# Patient Record
Sex: Male | Born: 1985
Health system: Southern US, Community
[De-identification: ages and names within clinical notes are randomized; demographics above are authoritative.]

---

## 2017-06-19 DIAGNOSIS — M7989 Other specified soft tissue disorders: Secondary | ICD-10-CM | POA: Diagnosis not present

## 2017-06-19 DIAGNOSIS — M7662 Achilles tendinitis, left leg: Secondary | ICD-10-CM | POA: Diagnosis not present

## 2018-02-02 DIAGNOSIS — M609 Myositis, unspecified: Secondary | ICD-10-CM | POA: Diagnosis not present

## 2018-02-02 DIAGNOSIS — M545 Low back pain: Secondary | ICD-10-CM | POA: Diagnosis not present

## 2018-02-06 DIAGNOSIS — T391X2A Poisoning by 4-Aminophenol derivatives, intentional self-harm, initial encounter: Secondary | ICD-10-CM | POA: Diagnosis not present

## 2018-02-06 DIAGNOSIS — R45851 Suicidal ideations: Secondary | ICD-10-CM | POA: Diagnosis not present

## 2018-02-06 DIAGNOSIS — F329 Major depressive disorder, single episode, unspecified: Secondary | ICD-10-CM | POA: Diagnosis not present

## 2018-02-06 DIAGNOSIS — F17210 Nicotine dependence, cigarettes, uncomplicated: Secondary | ICD-10-CM | POA: Diagnosis not present

## 2018-02-07 ENCOUNTER — Inpatient Hospital Stay (HOSPITAL_COMMUNITY)
Admission: AD | Admit: 2018-02-07 | Discharge: 2018-02-11 | DRG: 881 | Disposition: A | Discharge status: Home / Self Care | Payer: 59 | Source: Intra-hospital | Attending: Psychiatry | Admitting: Psychiatry

## 2018-02-07 ENCOUNTER — Encounter (HOSPITAL_COMMUNITY): Payer: Self-pay

## 2018-02-07 ENCOUNTER — Other Ambulatory Visit: Payer: Self-pay

## 2018-02-07 DIAGNOSIS — F419 Anxiety disorder, unspecified: Secondary | ICD-10-CM | POA: Diagnosis not present

## 2018-02-07 DIAGNOSIS — F4325 Adjustment disorder with mixed disturbance of emotions and conduct: Secondary | ICD-10-CM | POA: Diagnosis not present

## 2018-02-07 DIAGNOSIS — F4320 Adjustment disorder, unspecified: Secondary | ICD-10-CM | POA: Diagnosis present

## 2018-02-07 DIAGNOSIS — F1290 Cannabis use, unspecified, uncomplicated: Secondary | ICD-10-CM | POA: Diagnosis not present

## 2018-02-07 DIAGNOSIS — T391X2A Poisoning by 4-Aminophenol derivatives, intentional self-harm, initial encounter: Secondary | ICD-10-CM | POA: Diagnosis present

## 2018-02-07 DIAGNOSIS — F329 Major depressive disorder, single episode, unspecified: Secondary | ICD-10-CM | POA: Diagnosis not present

## 2018-02-07 DIAGNOSIS — T1491XA Suicide attempt, initial encounter: Secondary | ICD-10-CM | POA: Diagnosis not present

## 2018-02-07 DIAGNOSIS — R748 Abnormal levels of other serum enzymes: Secondary | ICD-10-CM | POA: Diagnosis present

## 2018-02-07 DIAGNOSIS — T39012A Poisoning by aspirin, intentional self-harm, initial encounter: Secondary | ICD-10-CM | POA: Diagnosis present

## 2018-02-07 DIAGNOSIS — F17210 Nicotine dependence, cigarettes, uncomplicated: Secondary | ICD-10-CM | POA: Diagnosis present

## 2018-02-07 DIAGNOSIS — Z630 Problems in relationship with spouse or partner: Secondary | ICD-10-CM | POA: Diagnosis not present

## 2018-02-07 DIAGNOSIS — R450 Nervousness: Secondary | ICD-10-CM | POA: Diagnosis not present

## 2018-02-07 DIAGNOSIS — F332 Major depressive disorder, recurrent severe without psychotic features: Secondary | ICD-10-CM | POA: Diagnosis not present

## 2018-02-07 DIAGNOSIS — Z813 Family history of other psychoactive substance abuse and dependence: Secondary | ICD-10-CM | POA: Diagnosis not present

## 2018-02-07 DIAGNOSIS — G4700 Insomnia, unspecified: Secondary | ICD-10-CM | POA: Diagnosis not present

## 2018-02-07 DIAGNOSIS — Z818 Family history of other mental and behavioral disorders: Secondary | ICD-10-CM | POA: Diagnosis not present

## 2018-02-07 DIAGNOSIS — T391X4A Poisoning by 4-Aminophenol derivatives, undetermined, initial encounter: Secondary | ICD-10-CM | POA: Diagnosis not present

## 2018-02-07 MED ORDER — IBUPROFEN 600 MG PO TABS
600.0000 mg | ORAL_TABLET | Freq: Four times a day (QID) | ORAL | Status: DC | PRN
  Administered 2018-02-07: 600 mg via ORAL
  Filled 2018-02-07: qty 1

## 2018-02-07 MED ORDER — HYDROXYZINE HCL 25 MG PO TABS
25.0000 mg | ORAL_TABLET | Freq: Three times a day (TID) | ORAL | Status: DC | PRN
Start: 2018-02-07 — End: 2018-02-11
  Administered 2018-02-07 – 2018-02-11 (×2): 25 mg via ORAL
  Filled 2018-02-07 (×3): qty 1

## 2018-02-07 MED ORDER — ACETAMINOPHEN 325 MG PO TABS: 650.0000 mg | ORAL_TABLET | Freq: Four times a day (QID) | ORAL | Status: DC | PRN

## 2018-02-07 MED ORDER — ALUM & MAG HYDROXIDE-SIMETH 200-200-20 MG/5ML PO SUSP: 30.0000 mL | ORAL | Status: DC | PRN

## 2018-02-07 MED ORDER — TRAZODONE HCL 50 MG PO TABS
50.0000 mg | ORAL_TABLET | Freq: Every evening | ORAL | Status: DC | PRN
  Administered 2018-02-07: 50 mg via ORAL
  Filled 2018-02-07 (×2): qty 1

## 2018-02-07 MED ORDER — NICOTINE POLACRILEX 2 MG MT GUM
2.0000 mg | CHEWING_GUM | OROMUCOSAL | Status: DC | PRN
Start: 2018-02-07 — End: 2018-02-11
  Administered 2018-02-07 – 2018-02-10 (×8): 2 mg via ORAL
  Filled 2018-02-07 (×6): qty 1

## 2018-02-07 MED ORDER — MAGNESIUM HYDROXIDE 400 MG/5ML PO SUSP: 30.0000 mL | Freq: Every day | ORAL | Status: DC | PRN

## 2018-02-07 NOTE — Tx Team (Signed)
Initial Treatment Plan 02/07/2018 10:04 PM Ricky Bond JYN:[id number]    PATIENT STRESSORS: Marital or family conflict Occupational concerns   PATIENT STRENGTHS: Capable of independent living Communication skills General fund of knowledge Motivation for treatment/growth Physical Health   PATIENT IDENTIFIED PROBLEMS: Depression  Suicidal ideation  "I want to get back to being mentally stable"  "Not have racing thought"               DISCHARGE CRITERIA:  Improved stabilization in mood, thinking, and/or behavior Verbal commitment to aftercare and medication compliance  PRELIMINARY DISCHARGE PLAN: Outpatient therapy Medication management  PATIENT/FAMILY INVOLVEMENT: This treatment plan has been presented to and reviewed with the patient, Ricky Bond.  The patient and family have been given the opportunity to ask questions and make suggestions.  Levin BaconHeather V Panhia Karl, RN 02/07/2018, 10:04 PM

## 2018-02-07 NOTE — Progress Notes (Signed)
Ricky Bond is a 32 year old male being admitted voluntarily to [address] [facility].  He came to the ED after taking 18 aspirin pills after argument with his ex-wife.  [facility], he denies SI/HI or A/V hallucinations.  He does report some depression, anxiety and racing thoughts.  He reports using marijuana daily to help with the anxiety.  He denies any medical issues and appears to be in no physical distress.  Oriented him to the unit.  Admission paperwork completed and signed.  Belongings searched and secured in locker # 52, no contraband found.  Skin assessment completed and no skin issues ntoed.  Q 15 minute checks initiated for safety.  We will continue to monitor the progress towards his goals.

## 2018-02-07 NOTE — BH Assessment (Addendum)
Assessment Note  Ricky AweJeromy R Bond is a 32 y.o. separated male who presented to [facility] for drug overdose. Pt reports he is separated from his wife for several months and learned today that she is in a new relationship. Pt states it was an impulsive reaction to grab the pills and take an overdose of 10-18 pills (either Tylenol or Asprin- both are listed in record).  Pt states he knew immediately that it was a poor decision and went to ED to get checked out. Pt reports he regrets taking the pills and that he has children to live for. Pt denies SI, HI & AVH. He denies hx of abuse. He has no hx of suicide attempts or inpt hospitalization. Pt reports he has anxiety but does not have a psychiatrist or therapist.  Diagnosis: F32.2 MDD single episode severe, without psychosis   Disposition: Leighton Ruffina Okonkwo, NP recommends Inpatient Therapy   Past Medical History: No past medical history on file.  Family History: No family history on file.  Social History:  has no tobacco, alcohol, and drug history on file.  Additional Social History:  Alcohol / Drug Use Pain Medications: see MAR Prescriptions: see MAR Over the Counter: see MAR Substance #1 Name of Substance 1: alcohol 1 - Frequency: occasional use Substance #2 Name of Substance 2: marijuana 2 - Frequency: pt reports use for tx of anxiety  CIWA:   COWS:    Allergies: Allergies not on file  Home Medications:  No medications prior to admission.    OB/GYN Status:  No LMP for male patient.  General Assessment Data Location of Assessment: [facility] TTS Assessment: Out of system Is this a Tele or Face-to-Face Assessment?: Face-to-Face Is this an Initial Assessment or a Re-assessment for this encounter?: Initial Assessment Marital status: Separated Living Arrangements: (with friends) Can pt return to current living arrangement?: Yes Admission Status: Voluntary Is patient capable of signing voluntary admission?:  Yes Referral Source: Other([facility]) Insurance type: [employer]     Crisis Care Plan Living Arrangements: (with friends) Name of Psychiatrist: none Name of Therapist: none  Education Status Highest grade of school patient has completed: 10th  Risk to self with the past 6 months Suicidal Ideation: No(denies) Has patient been a risk to self within the past 6 months prior to admission? : Yes Suicidal Intent: Yes-Currently Present(on impulse per pt) Is patient at risk for suicide?: Yes Suicidal Plan?: Yes-Currently Present(Took overdose of 10-16 tylenol) Has patient had any suicidal plan within the past 6 months prior to admission? : Other (comment) Specify Current Suicidal Plan: denies, states taking pills was impulsive reaction Access to Means: Yes What has been your use of drugs/alcohol within the last 12 months?: occasrional alcohol; regular marijuana for anxiety Previous Attempts/Gestures: No Intentional Self Injurious Behavior: (none reported) Family Suicide History: Unknown Recent stressful life event(s): Divorce Persecutory voices/beliefs?: No Depression Symptoms: Tearfulness, Despondent Substance abuse history and/or treatment for substance abuse?: No Suicide prevention information given to non-admitted patients: Not applicable  Risk to Others within the past 6 months Homicidal Ideation: No History of harm to others?: No Assessment of Violence: None Noted Criminal Charges Pending?: No Does patient have a court date: No Is patient on probation?: No  Psychosis Hallucinations: None noted Delusions: None noted  Mental Status Report Appearance/Hygiene: Unremarkable, In hospital gown Eye Contact: Good Motor Activity: Freedom of movement Speech: Logical/coherent Level of Consciousness: Alert Mood: Anxious Affect: Anxious, Blunted, Depressed Anxiety Level: Moderate Thought Processes: Coherent, Relevant Judgement:  Partial Orientation: Person, Place,  Time, Situation Obsessive Compulsive Thoughts/Behaviors: None  Cognitive Functioning Concentration: Normal Memory: Recent Intact, Remote Intact IQ: Average Insight: Fair Impulse Control: Fair  ADLScreening [facility]) Patient's cognitive ability adequate to safely complete daily activities?: Yes Patient able to express need for assistance with ADLs?: Yes Independently performs ADLs?: Yes (appropriate for developmental age)  Prior Inpatient Therapy Prior Inpatient Therapy: No  Prior Outpatient Therapy Prior Outpatient Therapy: No  ADL Screening (condition at time of admission) Patient's cognitive ability adequate to safely complete daily activities?: Yes Is the patient deaf or have difficulty hearing?: No Does the patient have difficulty seeing, even when wearing glasses/contacts?: No Does the patient have difficulty concentrating, remembering, or making decisions?: No Patient able to express need for assistance with ADLs?: Yes Does the patient have difficulty dressing or bathing?: No Independently performs ADLs?: Yes (appropriate for developmental age) Communication: Independent Dressing (OT): Independent Grooming: Independent Feeding: Independent Bathing: Independent Toileting: Independent In/Out Bed: Independent Walks in Home: Independent Does the patient have difficulty walking or climbing stairs?: No Weakness of Legs: None Weakness of Arms/Hands: None  Home Assistive Devices/Equipment Home Assistive Devices/Equipment: None  Therapy Consults (therapy consults require a physician order) PT Evaluation Needed: No OT Evalulation Needed: No SLP Evaluation Needed: No Abuse/Neglect Assessment (Assessment to be complete while patient is alone) Abuse/Neglect Assessment Can Be Completed: Unable to assess, patient is non-responsive or altered mental status Physical Abuse: Denies Values / Beliefs Cultural Requests During Hospitalization: None Spiritual  Requests During Hospitalization: None Consults Spiritual Care Consult Needed: No Social Work Consult Needed: No      Additional Information Does patient have medical clearance?: Yes     Disposition:  Disposition Initial Assessment Completed for this Encounter: Yes Disposition of Patient: Inpatient treatment program Type of inpatient treatment program: Adult  On Site Evaluation by:   Reviewed with Physician:    Clearnce Sorrel 02/07/2018 5:27 PM

## 2018-02-08 DIAGNOSIS — Z818 Family history of other mental and behavioral disorders: Secondary | ICD-10-CM

## 2018-02-08 DIAGNOSIS — T39012A Poisoning by aspirin, intentional self-harm, initial encounter: Secondary | ICD-10-CM

## 2018-02-08 DIAGNOSIS — Z813 Family history of other psychoactive substance abuse and dependence: Secondary | ICD-10-CM

## 2018-02-08 DIAGNOSIS — F1290 Cannabis use, unspecified, uncomplicated: Secondary | ICD-10-CM

## 2018-02-08 DIAGNOSIS — T391X2A Poisoning by 4-Aminophenol derivatives, intentional self-harm, initial encounter: Secondary | ICD-10-CM

## 2018-02-08 DIAGNOSIS — F17210 Nicotine dependence, cigarettes, uncomplicated: Secondary | ICD-10-CM

## 2018-02-08 DIAGNOSIS — G4700 Insomnia, unspecified: Secondary | ICD-10-CM

## 2018-02-08 DIAGNOSIS — F329 Major depressive disorder, single episode, unspecified: Principal | ICD-10-CM

## 2018-02-08 DIAGNOSIS — F419 Anxiety disorder, unspecified: Secondary | ICD-10-CM

## 2018-02-08 DIAGNOSIS — R450 Nervousness: Secondary | ICD-10-CM

## 2018-02-08 DIAGNOSIS — Z630 Problems in relationship with spouse or partner: Secondary | ICD-10-CM

## 2018-02-08 DIAGNOSIS — T1491XA Suicide attempt, initial encounter: Secondary | ICD-10-CM

## 2018-02-08 MED ORDER — SERTRALINE HCL 50 MG PO TABS
50.0000 mg | ORAL_TABLET | Freq: Every day | ORAL | Status: DC
  Administered 2018-02-08 – 2018-02-11 (×4): 50 mg via ORAL
  Filled 2018-02-08 (×7): qty 1

## 2018-02-08 NOTE — H&P (Addendum)
Psychiatric Admission Assessment Adult  Patient Identification: Ricky Bond MRN:  [id number] Date of Evaluation:  02/08/2018 Chief Complaint:  MDD Principal Diagnosis: MDD (major depressive disorder) Diagnosis:   Patient Active Problem List   Diagnosis Date Noted  . MDD (major depressive disorder) [F32.9] 02/07/2018   History of Present Illness:  Per assessment note- Ricky Bond is a 32 y.o. separated male who presented to [facility] ED for drug overdose. Pt reports he is separated from his wife for several months and learned today that she is in a new relationship. Pt states it was an impulsive reaction to grab the pills and take an overdose of 10-18 pills (either Tylenol or Asprin- both are listed in record).  Pt states he knew immediately that it was a poor decision and went to ED to get checked out. Pt reports he regrets taking the pills and that he has children to live for. Pt denies SI, HI & AVH. He denies hx of abuse. He has no hx of suicide attempts or inpt hospitalization. Pt reports he has anxiety but does not have a psychiatrist or therapist.  On Evaluation: Ricky Bond is awake, alert and oriented.  Denies suicidal or homicidal ideation during this assessment. Denies auditory or visual hallucination and does not appear to be responding to internal stimuli.  Reports history of depression and this "event pushed me over the edge"  Report he is interested in taking medications.  Support, encouragement and reassurance was provided.   Associated Signs/Symptoms: Depression Symptoms:  depressed mood, feelings of worthlessness/guilt, difficulty concentrating, (Hypo) Manic Symptoms:  Distractibility, Anxiety Symptoms:  Excessive Worry, Psychotic Symptoms:  Hallucinations: None PTSD Symptoms: Hyperarousal:  Difficulty Concentrating Irritability/Anger Total Time spent with patient: 20 minutes  Past Psychiatric History:   Is the patient at risk to self? Yes.    Has the  patient been a risk to self in the past 6 months? Yes.    Has the patient been a risk to self within the distant past? Yes.    Is the patient a risk to others? No.  Has the patient been a risk to others in the past 6 months? Yes.    Has the patient been a risk to others within the distant past? No.   Prior Inpatient Therapy: Prior Inpatient Therapy: No Prior Outpatient Therapy: Prior Outpatient Therapy: No  Alcohol Screening: 1. How often do you have a drink containing alcohol?: Monthly or less 2. How many drinks containing alcohol do you have on a typical day when you are drinking?: 1 or 2 3. How often do you have six or more drinks on one occasion?: Never AUDIT-C Score: 1 9. Have you or someone else been injured as a result of your drinking?: No 10. Has a relative or friend or a doctor or another health worker been concerned about your drinking or suggested you cut down?: No Alcohol Use Disorder Identification Test Final Score (AUDIT): 1 Intervention/Follow-up: AUDIT Score <7 follow-up not indicated Substance Abuse History in the last 12 months:  No. Consequences of Substance Abuse: NA Previous Psychotropic Medications: no Psychological Evaluations: no  Past Medical History: History reviewed. No pertinent past medical history. History reviewed. No pertinent surgical history. Family History:  Family History  Family history unknown: Yes   Family Psychiatric  History: Tobacco Screening: Have you used any form of tobacco in the last 30 days? (Cigarettes, Smokeless Tobacco, Cigars, and/or Pipes): Yes Tobacco use, Select all that apply: 5 or more cigarettes per day  Are you interested in Tobacco Cessation Medications?: Yes, will notify MD for an order Counseled patient on smoking cessation including recognizing danger situations, developing coping skills and basic information about quitting provided: Refused/Declined practical counseling Social History:  Social History   Substance and  Sexual Activity  Alcohol Use No  . Frequency: Never     Social History   Substance and Sexual Activity  Drug Use Yes  . Types: Marijuana    Additional Social History: Marital status: Separated    Pain Medications: see MAR Prescriptions: see MAR Over the Counter: see MAR Name of Substance 1: alcohol 1 - Frequency: occasional use Name of Substance 2: marijuana 2 - Frequency: pt reports use for tx of anxiety                Allergies:  No Known Allergies Lab Results: No results found for this or any previous visit (from the past 108 hour(s)).  Blood Alcohol level:  No results found for: [facility]  Metabolic Disorder Labs:  No results found for: HGBA1C, MPG No results found for: PROLACTIN No results found for: CHOL, TRIG, HDL, CHOLHDL, VLDL, LDLCALC  Current Medications: Current Facility-Administered Medications  Medication Dose Route Frequency Provider Last Rate Last Dose  . acetaminophen (TYLENOL) tablet 650 mg  650 mg Oral Q6H PRN Okonkwo, Justina A, NP      . alum & mag hydroxide-simeth (MAALOX/MYLANTA) 200-200-20 MG/5ML suspension 30 mL  30 mL Oral Q4H PRN Okonkwo, Justina A, NP      . hydrOXYzine (ATARAX/VISTARIL) tablet 25 mg  25 mg Oral TID PRN Lu Duffel, Justina A, NP   25 mg at 02/07/18 2216  . ibuprofen (ADVIL,MOTRIN) tablet 600 mg  600 mg Oral Q6H PRN Lindon Romp A, NP   600 mg at 02/07/18 2054  . magnesium hydroxide (MILK OF MAGNESIA) suspension 30 mL  30 mL Oral Daily PRN Okonkwo, Justina A, NP      . nicotine polacrilex (NICORETTE) gum 2 mg  2 mg Oral PRN Glynn Freas, Myer Peer, MD   2 mg at 02/07/18 2006  . sertraline (ZOLOFT) tablet 50 mg  50 mg Oral Daily Derrill Center, NP      . traZODone (DESYREL) tablet 50 mg  50 mg Oral QHS PRN Okonkwo, Justina A, NP   50 mg at 02/07/18 2216   PTA Medications: No medications prior to admission.    Musculoskeletal: Strength & Muscle Tone: within normal limits Gait & Station: normal Patient leans: N/A  Psychiatric  Specialty Exam: Physical Exam  Nursing note and vitals reviewed. Constitutional: He is oriented to person, place, and time. He appears well-developed.  Neurological: He is alert and oriented to person, place, and time.  Psychiatric: He has a normal mood and affect. His behavior is normal.    Review of Systems  Psychiatric/Behavioral: Positive for suicidal ideas. The patient is nervous/anxious.     Blood pressure (!) 141/90, pulse 80, temperature 98.9 F (37.2 C), temperature source Oral, resp. rate 18, height 6' (1.829 m), weight 100.9 kg (222 lb 8 oz), SpO2 99 %.Body mass index is 30.18 kg/m.  General Appearance: Casual  Eye Contact:  Good  Speech:  Clear and Coherent  Volume:  Normal  Mood:  Anxious and Depressed  Affect:  Appropriate and Congruent  Thought Process:  Coherent  Orientation:  Full (Time, Place, and Person)  Thought Content:  NA  Suicidal Thoughts:  Yes.  with intent/plan  Homicidal Thoughts:  No  Memory:  Immediate;  Fair Recent;   Fair Remote;   Fair  Judgement:  Fair  Insight:  Fair  Psychomotor Activity:  Normal  Concentration:  Concentration: Fair  Recall:  [employer] of Knowledge:  Fair  Language:  Fair  Akathisia:  No  Handed:  Right  AIMS (if indicated):     Assets:  Communication Skills Desire for Improvement Resilience Social Support  ADL's:  Intact  Cognition:  WNL  Sleep:  Number of Hours: 6.75    Treatment Plan Summary: Daily contact with patient to assess and evaluate symptoms and progress in treatment and Medication management    Initiate  Zoloft 50 mg  mood stabilization. Continue with Trazodone 50 mg for insomnia  Will continue to monitor vitals ,medication compliance and treatment side effects while patient is here.  Reviewed labs- see [place]  chart  ,BAL -, UDS - positive  thc  CSW will start working on disposition.  Patient to participate in therapeutic milieu  Observation Level/Precautions:  15 minute checks   Laboratory:  CBC Chemistry Profile HbAIC UDS  Psychotherapy:   Individual and group session  Medications:  See SRA  Consultations:  CSW and Psychiatry  Discharge Concerns:  Safety, stabilization, and risk of access to medication and medication stabilization   Estimated LOS: 5-7days  Other:     Physician Treatment Plan for Primary Diagnosis: MDD (major depressive disorder) Long Term Goal(s): Improvement in symptoms so as ready for discharge  Short Term Goals: Ability to identify changes in lifestyle to reduce recurrence of condition will improve, Ability to verbalize feelings will improve, Ability to disclose and discuss suicidal ideas and Ability to demonstrate self-control will improve  Physician Treatment Plan for Secondary Diagnosis: Principal Problem:   MDD (major depressive disorder)  Long Term Goal(s): Improvement in symptoms so as ready for discharge  Short Term Goals: Ability to demonstrate self-control will improve, Ability to identify and develop effective coping behaviors will improve, Ability to maintain clinical measurements within normal limits will improve and Compliance with prescribed medications will improve  I certify that inpatient services furnished can reasonably be expected to improve the patient's condition.    Derrill Center, NP 2/24/20198:58 AM   I have discussed case with NP and have met with patient  Agree with NP note and assessment  32 year old male, married but currently separated, has 6 children, who currently live with their mothers. Employed . Patient presented to [facility] ED on 2/22. States " I had been doing OK, but have been separated for a few months and what she says still gets to me". States he had a recent discussion with her during which she threatened to relocate out of state and take his children with her. States that he impulsively overdosed on about 69 ASA/Acetaminophen  tablets. It was unplanned, " spur of the moment". States she  contacted police and was brought to ED. This event occurred 2/22. States he has been vaguely depressed, sad, but states " it was mild, not bad, I was just trying to stay focused on my work" " I was doing OK".  Denies neuro-vegetative symptoms recently. Denies any history of psychosis.  No prior psychiatric admissions, no prior suicide attempts, no history of severe depression, no history of mania, no history of psychosis. Denies panic or agoraphobia. Describes history of excessive worrying , " I do worry a lot ". States he has never been on any psychiatric medications  Denies alcohol abuse, smokes cannabis regularly, denies other drug use .  Denies medical issues .  Dx- Adjustment Disorder, with Depressed Mood, Anxiety. Consider GAD. S/P Overdose .   Plan- Inpatient admission. Has been started on  Zoloft for depression, anxiety. Zoloft 50 mgrs QDAY. Trazodone and Vistaril PrNS.  Will recheck LFTs , based on recent acetaminophen overdose.

## 2018-02-08 NOTE — Progress Notes (Signed)
Writer spoke with patient 1:after his admission was completed. He was informed of his medications available and requested gum.He attended group shortly after. He is adjusting to the unit. Safety maintained on unit with 15 min checks.

## 2018-02-08 NOTE — [facility] Group Notes (Signed)
[facility] Group Notes:  (Nursing/MHT/Case Management/Adjunct)  Date:  02/08/2018  Time:  9:14 AM  Type of Therapy:  Goals/Orientation Group.  Participation Level:  Active  Participation Quality:  Appropriate  Affect:  Appropriate  Cognitive:  Appropriate  Insight:  Appropriate  Engagement in Group:  Engaged  Modes of Intervention:  Discussion  Summary of Progress/Problems: Pt attended goals/orientation group, pt was receptive.   Javonni Macke Shanta 02/08/2018, 9:14 AM 

## 2018-02-08 NOTE — Progress Notes (Signed)
Adult Psychoeducational Group Note  Date:  02/08/2018 Time:  9:19 PM  Group Topic/Focus:  Wrap-Up Group:   The focus of this group is to help patients review their daily goal of treatment and discuss progress on daily workbooks.  Participation Level:  Active  Participation Quality:  Appropriate  Affect:  Appropriate  Cognitive:  Alert and Oriented  Insight: Good  Engagement in Group:  Engaged  Modes of Intervention:  Discussion  Additional Comments:    Leo GrosserMegan A Darris Carachure 02/08/2018, 9:19 PM

## 2018-02-08 NOTE — [facility] Group Notes (Signed)
[facility]  Date/Time:  02/08/2018  11:00AM-12:00PM  Type of Therapy and Topic:  Group Therapy:  Music and Mood  Participation Level:  Active   Description of Group: In this process group, members listened to a variety of genres of music and identified that different types of music evoke different responses.  Patients were encouraged to identify music that was soothing for them and music that was energizing for them.  Patients discussed how this knowledge can help with wellness and recovery in various ways including managing depression and anxiety as well as encouraging healthy sleep habits.    Therapeutic Goals: 1. Patients will explore the impact of different varieties of music on mood 2. Patients will verbalize the thoughts they have when listening to different types of music 3. Patients will identify music that is soothing to them as well as music that is energizing to them 4. Patients will discuss how to use this knowledge to assist in maintaining wellness and recovery 5. Patients will explore the use of music as a coping skill  Summary of Patient Progress:  At the beginning of group, patient was not present, but he arrived for the last half of group and said he felt okay at the beginning, but better and "where I want to be" at the end.  Therapeutic Modalities: Solution Focused Brief Therapy Activity   Ricky MantleMareida Grossman-Orr, LCSW

## 2018-02-08 NOTE — Progress Notes (Signed)
Writer has observed patient up and active on the unit. Writer spoke with him 1:1 after he requested gum and he reports that he is glad that he came here. He reports being hesitant at first but feels that this is something he needed. He reports attending group and is making new friends while here. Support given and safety maintained on unit with 15 min checks.

## 2018-02-08 NOTE — [facility] Group Notes (Signed)
[facility] Group Notes:  (Nursing/MHT/Case Management/Adjunct)  Date:  02/08/2018  Time:  3:00 PM  Type of Therapy:  Psychoeducational Skills  Participation Level:  Active  Participation Quality:  Appropriate  Affect:  Appropriate  Cognitive:  Appropriate  Insight:  Appropriate  Engagement in Group:  Engaged  Modes of Intervention:  Problem-solving  Summary of Progress/Problems: Pt attended Psychoeducational group with top topic love languages.   Jacquelyne BalintForrest, Jaclene Bartelt Shanta 02/08/2018, 3:00 PM

## 2018-02-08 NOTE — [facility] Suicide Risk Assessment (Signed)
[facility]   Nursing information obtained from:  Patient Demographic factors:  Male, Caucasian Current Mental Status:  NA Loss Factors:  NA Historical Factors:  Family history of mental illness or substance abuse Risk Reduction Factors:  Responsible for children under 34 years of age, Living with another person, especially a relative  Total Time spent with patient: 45 minutes Principal Problem:  S/P Overdose  Diagnosis:   Patient Active Problem List   Diagnosis Date Noted  . MDD (major depressive disorder) [F32.9] 02/07/2018    Continued Clinical Symptoms:  Alcohol Use Disorder Identification Test Final Score (AUDIT): 1 The "Alcohol Use Disorders Identification Test", Guidelines for Use in Primary Care, Second Edition.  World Science writerHealth Organization [facility](WHO). Score between 0-7:  no or low risk or alcohol related problems. Score between 8-15:  moderate risk of alcohol related problems. Score between 16-19:  high risk of alcohol related problems. Score 20 or above:  warrants further diagnostic evaluation for alcohol dependence and treatment.   CLINICAL FACTORS:  32 year old male, married but currently separated, has 6 children, who currently live with their mothers. Employed . Patient presented to [facility] ED on 2/22. States " I had been doing OK, but have been separated for a few months and what she says still gets to me". States he had a recent discussion with her during which she threatened to relocate out of state and take his children with her. States that he impulsively overdosed on about 3918 ASA/Acetaminophen  tablets. It was unplanned, " spur of the moment". States she contacted police and was brought to ED. This event occurred 2/22. States he has been vaguely depressed, sad, but states " it was mild, not bad, I was just trying to stay focused on my work" " I was doing OK".  Denies neuro-vegetative symptoms recently. Denies any history of psychosis.  No prior  psychiatric admissions, no prior suicide attempts, no history of severe depression, no history of mania, no history of psychosis. Denies panic or agoraphobia. Describes history of excessive worrying , " I do worry a lot ". States he has never been on any psychiatric medications  Denies alcohol abuse, smokes cannabis regularly, denies other drug use .  Denies medical issues .  Dx- Adjustment Disorder, with Depressed Mood, Anxiety. Consider GAD. S/P Overdose .   Plan- Inpatient admission. Has been started on  Zoloft for depression, anxiety. Zoloft 50 mgrs QDAY. Trazodone and Vistaril PrNS.  Will recheck LFTs , based on recent acetaminophen overdose.     Musculoskeletal: Strength & Muscle Tone: within normal limits Gait & Station: normal Patient leans: N/A  Psychiatric Specialty Exam: Physical Exam  ROS no headache, no chest pain, no dyspnea, no nausea , no vomiting, no choluria or acholia, no RUQ pain  Blood pressure (!) 141/90, pulse 80, temperature 98.9 F (37.2 C), temperature source Oral, resp. rate 18, height 6' (1.829 m), weight 100.9 kg (222 lb 8 oz), SpO2 99 %.Body mass index is 30.18 kg/m.  General Appearance: Well Groomed  Eye Contact:  Good  Speech:  Normal Rate  Volume:  Normal  Mood:  reports feeling better today, states " my mood is pretty good today"  Affect:  Appropriate and vaguely anxious   Thought Process:  Linear and Descriptions of Associations: Intact  Orientation:  Full (Time, Place, and Person)  Thought Content:  no hallucinations, no delusions, not internally preoccupied   Suicidal Thoughts:  No denies suicidal or self injurious ideations, denies any  homicidal or violent ideations  Homicidal Thoughts:  No  Memory:  recent and remote grossly intact   Judgement:  Other:  improved  Insight:  improved   Psychomotor Activity:  Normal  Concentration:  Concentration: Good and Attention Span: Good  Recall:  Good  Fund of Knowledge:  Good  Language:  Good   Akathisia:  Negative  Handed:  Right  AIMS (if indicated):     Assets:  Communication Skills Desire for Improvement Resilience  ADL's:  Intact  Cognition:  WNL  Sleep:  Number of Hours: 6.75      COGNITIVE FEATURES THAT CONTRIBUTE TO RISK:  Closed-mindedness and Loss of executive function    SUICIDE RISK:   Mild:  Suicidal ideation of limited frequency, intensity, duration, and specificity.  There are no identifiable plans, no associated intent, mild dysphoria and related symptoms, good self-control (both objective and subjective assessment), few other risk factors, and identifiable protective factors, including available and accessible social support.  PLAN OF CARE: Patient will be admitted to inpatient psychiatric unit for stabilization and safety. Will provide and encourage milieu participation. Provide medication management and maked adjustments as needed.  Will follow daily.    I certify that inpatient services furnished can reasonably be expected to improve the patient's condition.   Craige Cotta, MD 02/08/2018, 9:51 AM

## 2018-02-09 DIAGNOSIS — F332 Major depressive disorder, recurrent severe without psychotic features: Secondary | ICD-10-CM

## 2018-02-09 DIAGNOSIS — T391X4A Poisoning by 4-Aminophenol derivatives, undetermined, initial encounter: Secondary | ICD-10-CM

## 2018-02-09 LAB — TSH: TSH: 2.853 u[IU]/mL (ref 0.350–4.500)

## 2018-02-09 LAB — HEPATIC FUNCTION PANEL
ALT: 90 U/L — ABNORMAL HIGH (ref 17–63)
AST: 64 U/L — AB (ref 15–41)
Albumin: 4.8 g/dL (ref 3.5–5.0)
Alkaline Phosphatase: 83 U/L (ref 38–126)
BILIRUBIN DIRECT: 0.1 mg/dL (ref 0.1–0.5)
BILIRUBIN INDIRECT: 0.9 mg/dL (ref 0.3–0.9)
BILIRUBIN TOTAL: 1 mg/dL (ref 0.3–1.2)
Total Protein: 7.6 g/dL (ref 6.5–8.1)

## 2018-02-09 MED ORDER — IBUPROFEN 600 MG PO TABS: 600.0000 mg | ORAL_TABLET | Freq: Four times a day (QID) | ORAL | Status: DC | PRN

## 2018-02-09 NOTE — Tx Team (Signed)
Interdisciplinary Treatment and Diagnostic Plan Update  02/09/2018 Time of Session: 10:00a Ricky Bond MRN: [id number]  Principal Diagnosis: MDD (major depressive disorder)  Secondary Diagnoses: Principal Problem:   MDD (major depressive disorder) Active Problems:   Intentional acetaminophen overdose ([place])   Current Medications:  Current Facility-Administered Medications  Medication Dose Route Frequency Provider Last Rate Last Dose  . acetaminophen (TYLENOL) tablet 650 mg  650 mg Oral Q6H PRN Okonkwo, Justina A, NP      . alum & mag hydroxide-simeth (MAALOX/MYLANTA) 200-200-20 MG/5ML suspension 30 mL  30 mL Oral Q4H PRN Okonkwo, Justina A, NP      . hydrOXYzine (ATARAX/VISTARIL) tablet 25 mg  25 mg Oral TID PRN Lu Duffel, Justina A, NP   25 mg at 02/07/18 2216  . ibuprofen (ADVIL,MOTRIN) tablet 600 mg  600 mg Oral Q6H PRN Lindon Romp A, NP   600 mg at 02/07/18 2054  . magnesium hydroxide (MILK OF MAGNESIA) suspension 30 mL  30 mL Oral Daily PRN Okonkwo, Justina A, NP      . nicotine polacrilex (NICORETTE) gum 2 mg  2 mg Oral PRN Cobos, Myer Peer, MD   2 mg at 02/09/18 0801  . sertraline (ZOLOFT) tablet 50 mg  50 mg Oral Daily Derrill Center, NP   50 mg at 02/09/18 0800  . traZODone (DESYREL) tablet 50 mg  50 mg Oral QHS PRN Okonkwo, Justina A, NP   50 mg at 02/07/18 2216   PTA Medications: No medications prior to admission.    Patient Stressors: Marital or family conflict Occupational concerns  Patient Strengths: Capable of independent living Curator fund of knowledge Motivation for treatment/growth Physical Health  Treatment Modalities: Medication Management, Group therapy, Case management,  1 to 1 session with clinician, Psychoeducation, Recreational therapy.   Physician Treatment Plan for Primary Diagnosis: MDD (major depressive disorder) Long Term Goal(s): Improvement in symptoms so as ready for discharge Improvement in symptoms so as ready  for discharge   Short Term Goals: Ability to identify changes in lifestyle to reduce recurrence of condition will improve Ability to verbalize feelings will improve Ability to disclose and discuss suicidal ideas Ability to demonstrate self-control will improve Ability to demonstrate self-control will improve Ability to identify and develop effective coping behaviors will improve Ability to maintain clinical measurements within normal limits will improve Compliance with prescribed medications will improve  Medication Management: Evaluate patient's response, side effects, and tolerance of medication regimen.  Therapeutic Interventions: 1 to 1 sessions, Unit Group sessions and Medication administration.  Evaluation of Outcomes: Not Met  Physician Treatment Plan for Secondary Diagnosis: Principal Problem:   MDD (major depressive disorder) Active Problems:   Intentional acetaminophen overdose ([place])  Long Term Goal(s): Improvement in symptoms so as ready for discharge Improvement in symptoms so as ready for discharge   Short Term Goals: Ability to identify changes in lifestyle to reduce recurrence of condition will improve Ability to verbalize feelings will improve Ability to disclose and discuss suicidal ideas Ability to demonstrate self-control will improve Ability to demonstrate self-control will improve Ability to identify and develop effective coping behaviors will improve Ability to maintain clinical measurements within normal limits will improve Compliance with prescribed medications will improve     Medication Management: Evaluate patient's response, side effects, and tolerance of medication regimen.  Therapeutic Interventions: 1 to 1 sessions, Unit Group sessions and Medication administration.  Evaluation of Outcomes: Not Met   RN Treatment Plan for Primary Diagnosis: MDD (major depressive disorder)  Long Term Goal(s): Knowledge of disease and therapeutic regimen to  maintain health will improve  Short Term Goals: Ability to remain free from injury will improve, Ability to verbalize frustration and anger appropriately will improve, Ability to demonstrate self-control, Ability to participate in decision making will improve, Ability to verbalize feelings will improve, Ability to disclose and discuss suicidal ideas, Ability to identify and develop effective coping behaviors will improve and Compliance with prescribed medications will improve  Medication Management: RN will administer medications as ordered by provider, will assess and evaluate patient's response and provide education to patient for prescribed medication. RN will report any adverse and/or side effects to prescribing provider.  Therapeutic Interventions: 1 on 1 counseling sessions, Psychoeducation, Medication administration, Evaluate responses to treatment, Monitor vital signs and CBGs as ordered, Perform/monitor CIWA, COWS, AIMS and Fall Risk screenings as ordered, Perform wound care treatments as ordered.  Evaluation of Outcomes: Not Met   LCSW Treatment Plan for Primary Diagnosis: MDD (major depressive disorder) Long Term Goal(s): Safe transition to appropriate next level of care at discharge, Engage patient in therapeutic group addressing interpersonal concerns.  Short Term Goals: Engage patient in aftercare planning with referrals and resources, Increase social support, Increase ability to appropriately verbalize feelings, Increase emotional regulation, Facilitate acceptance of mental health diagnosis and concerns, Facilitate patient progression through stages of change regarding substance use diagnoses and concerns, Identify triggers associated with mental health/substance abuse issues and Increase skills for wellness and recovery  Therapeutic Interventions: Assess for all discharge needs, 1 to 1 time with Social worker, Explore available resources and support systems, Assess for adequacy in  community support network, Educate family and significant other(s) on suicide prevention, Complete Psychosocial Assessment, Interpersonal group therapy.  Evaluation of Outcomes: Not Met   Progress in Treatment: Attending groups: No.New to unit Participating in groups: No. Taking medication as prescribed: No. Toleration medication: Yes. Family/Significant other contact made: No, will contact:  CSW will assess for an appropiate contact, if patient gives consent Patient understands diagnosis: Yes. Discussing patient identified problems/goals with staff: Yes. Medical problems stabilized or resolved: Yes. Denies suicidal/homicidal ideation: Yes. Issues/concerns per patient self-inventory: No. Other:   New problem(s) identified: None  New Short Term/Long Term Goal(s):medication stabilization, elimination of SI thoughts, development of comprehensive mental wellness plan.   Patient Goal: "To think positive and treat my depression"   Discharge Plan or Barriers: CSW will assess for an appropriate discharge plan.   Reason for Continuation of Hospitalization: Anxiety Depression Medication stabilization Suicidal ideation  Estimated Length of Stay: Wednesday, 02/11/18  Attendees: Patient: Ricky Bond 02/09/2018 8:52 AM  Physician: Dr. Neita Garnet 02/09/2018 8:52 AM  Nursing: Chrys Racer, RN  02/09/2018 8:52 AM  RN Care Manager: Rhunette Croft 02/09/2018 8:52 AM  Social Worker: Radonna Ricker, [place] 02/09/2018 8:52 AM  Recreational Therapist: Rhunette Croft 02/09/2018 8:52 AM  Other:  02/09/2018 8:52 AM  Other:  02/09/2018 8:52 AM  Other: 02/09/2018 8:52 AM    Scribe for Treatment Team: Marylee Floras, [place] 02/09/2018 8:52 AM

## 2018-02-09 NOTE — Plan of Care (Signed)
  Progressing Education: Emotional status will improve 02/09/2018 1017 - Progressing by Angela AdamBeaudry, Konnor Jorden E, RN Mental status will improve 02/09/2018 1017 - Progressing by Angela AdamBeaudry, Linsi Humann E, RN

## 2018-02-09 NOTE — Progress Notes (Addendum)
[facility] MD Progress Note  02/09/2018 2:42 PM Ricky Bond  MRN:  [id number]  Subjective: Ricky Bond reports, "I feel a lot better today. I have not felt this good in a very long time. I feel like my old-self again. I'm sleeping good, eating good. Group sessions has been great. I came here because I got into it with my ex-wife. She knows how to push that button to get to me. This time, I fell for & ate 18 tablets of Tylenol, impulsively. As soon as I had done that, I knew it was wrong, I went to the ED right away. I feel grate today".  Objective: 32 year old male, married but currently separated, has 6 children, who currently live with their mothers. Employed. Patient presented to [facility] ED on 2/22. States " I had been doing OK, but have been separated for a few months and what she says still gets to me". States he had a recent discussion with her during which she threatened to relocate out of state and take his children with her. States that he impulsively overdosed on about 38 ASA/Acetaminophen tablets. It was unplanned, " spur of the moment". States she contacted police and was brought to ED. This event occurred 2/22. States he has been vaguely depressed, sad, but states " it was mild, not bad, I was just trying to stay focused on my work" " I was doing. OK".  Denies neuro-vegetative symptoms recently. Denies any history   Principal Problem: MDD (major depressive disorder)  Diagnosis:   Patient Active Problem List   Diagnosis Date Noted  . Intentional acetaminophen overdose ([facility]) [T39.1X2A]   . MDD (major depressive disorder) [F32.9] 02/07/2018   Total Time spent with patient: 25 minutes  Past Psychiatric History: See H&P.  Past Medical History: History reviewed. No pertinent past medical history. History reviewed. No pertinent surgical history.  Family History:  Family History  Family history unknown: Yes   Family Psychiatric  History: See H&P.  Social History:  Social History    Substance and Sexual Activity  Alcohol Use No  . Frequency: Never     Social History   Substance and Sexual Activity  Drug Use Yes  . Types: Marijuana    Social History   Socioeconomic History  . Marital status: Married    Spouse name: None  . Number of children: None  . Years of education: None  . Highest education level: None  Social Needs  . Financial resource strain: None  . Food insecurity - worry: None  . Food insecurity - inability: None  . Transportation needs - medical: None  . Transportation needs - non-medical: None  Occupational History  . None  Tobacco Use  . Smoking status: Current Every Day Smoker    Packs/day: 1.00    Types: Cigarettes  . Smokeless tobacco: Never Used  Substance and Sexual Activity  . Alcohol use: No    Frequency: Never  . Drug use: Yes    Types: Marijuana  . Sexual activity: Yes  Other Topics Concern  . None  Social History Narrative  . None   Additional Social History:  Pain Medications: see MAR Prescriptions: see MAR Over the Counter: see MAR Name of Substance 1: alcohol 1 - Frequency: occasional use Name of Substance 2: marijuana 2 - Frequency: pt reports use for tx of anxiety  Sleep: Good  Appetite:  Good  Current Medications: Current Facility-Administered Medications  Medication Dose Route Frequency Provider Last Rate Last Dose  .  acetaminophen (TYLENOL) tablet 650 mg  650 mg Oral Q6H PRN Okonkwo, Justina A, NP      . alum & mag hydroxide-simeth (MAALOX/MYLANTA) 200-200-20 MG/5ML suspension 30 mL  30 mL Oral Q4H PRN Okonkwo, Justina A, NP      . hydrOXYzine (ATARAX/VISTARIL) tablet 25 mg  25 mg Oral TID PRN Beryle Lathekonkwo, Justina A, NP   25 mg at 02/07/18 2216  . ibuprofen (ADVIL,MOTRIN) tablet 600 mg  600 mg Oral Q6H PRN Cobos, Fernando A, MD      . magnesium hydroxide (MILK OF MAGNESIA) suspension 30 mL  30 mL Oral Daily PRN Okonkwo, Justina A, NP      . nicotine polacrilex (NICORETTE) gum 2 mg  2 mg Oral PRN Cobos,  Rockey SituFernando A, MD   2 mg at 02/09/18 0801  . sertraline (ZOLOFT) tablet 50 mg  50 mg Oral Daily Oneta RackLewis, Tanika N, NP   50 mg at 02/09/18 0800  . traZODone (DESYREL) tablet 50 mg  50 mg Oral QHS PRN Beryle Lathekonkwo, Justina A, NP   50 mg at 02/07/18 2216   Lab Results:  Results for orders placed or performed during the hospital encounter of 02/07/18 (from the past 48 hour(s))  TSH     Status: None   Collection Time: 02/09/18  6:27 AM  Result Value Ref Range   TSH 2.853 0.350 - 4.500 uIU/mL    Comment: Performed by a 3rd Generation assay with a functional sensitivity of <=0.01 uIU/mL. Performed at [facility], [address] [place], [place] [address]   Hepatic function panel     Status: Abnormal   Collection Time: 02/09/18  6:27 AM  Result Value Ref Range   Total Protein 7.6 6.5 - 8.1 g/dL   Albumin 4.8 3.5 - 5.0 g/dL   AST 64 (H) 15 - 41 U/L   ALT 90 (H) 17 - 63 U/L   Alkaline Phosphatase 83 38 - 126 U/L   Total Bilirubin 1.0 0.3 - 1.2 mg/dL   Bilirubin, Direct 0.1 0.1 - 0.5 mg/dL   Indirect Bilirubin 0.9 0.3 - 0.9 mg/dL    Comment: Performed at [facility], [address] [place], [place] [address]   Blood Alcohol level:  No results found for: [facility]  Metabolic Disorder Labs: No results found for: HGBA1C, MPG No results found for: PROLACTIN No results found for: CHOL, TRIG, HDL, CHOLHDL, VLDL, LDLCALC  Physical Findings: AIMS: Facial and Oral Movements Muscles of Facial Expression: None, normal Lips and Perioral Area: None, normal Jaw: None, normal Tongue: None, normal,Extremity Movements Upper (arms, wrists, hands, fingers): None, normal Lower (legs, knees, ankles, toes): None, normal, Trunk Movements Neck, shoulders, hips: None, normal, Overall Severity Severity of abnormal movements (highest score from questions above): None, normal Incapacitation due to abnormal movements: None, normal Patient's awareness of abnormal movements (rate only  patient's report): No Awareness, Dental Status Current problems with teeth and/or dentures?: No Does patient usually wear dentures?: No  CIWA:    COWS:     Musculoskeletal: Strength & Muscle Tone: within normal limits Gait & Station: normal Patient leans: N/A  Psychiatric Specialty Exam: Physical Exam  ROS  Blood pressure (!) 144/85, pulse 75, temperature 97.8 F (36.6 C), temperature source Oral, resp. rate 18, height 6' (1.829 m), weight 100.9 kg (222 lb 8 oz), SpO2 99 %.Body mass index is 30.18 kg/m.  General Appearance: Well Groomed  Eye Contact:  Good  Speech:  Normal Rate  Volume:  Normal  Mood:  reports feeling better today, states " my mood is pretty good today"  Affect:  Appropriate and vaguely anxious   Thought Process:  Linear and Descriptions of Associations: Intact  Orientation:  Full (Time, Place, and Person)  Thought Content:  no hallucinations, no delusions, not internally preoccupied   Suicidal Thoughts:  No denies suicidal or self injurious ideations, denies any homicidal or violent ideations  Homicidal Thoughts:  No  Memory:  recent and remote grossly intact   Judgement:  Other:  improved  Insight:  improved   Psychomotor Activity:  Normal  Concentration:  Concentration: Good and Attention Span: Good  Recall:  Good  Fund of Knowledge:  Good  Language:  Good  Akathisia:  Negative  Handed:  Right  AIMS (if indicated):     Assets:  Communication Skills Desire for Improvement Resilience  ADL's:  Intact  Cognition:  WNL  Sleep:  Number of Hours: 6.75     Treatment Plan Summary: Daily contact with patient to assess and evaluate symptoms and progress in treatment: Patient reports improved mood. He says his symptoms are responding to his treatment regimen.    - Will continue today 02/09/2018 plan as below except where it is noted.  Anxiety.    - Continue Hydroxyzine 25 mg prn for anxiety.  Depression.    - Continue Sertraline 50 mg po  daily.  Insomnia.    - Continue Trazodone 50 mg po prn Q hs.  Nicotine withdrawal.    - Continue Nicorette gum prn.     - Encourage group participation.    - Discharge disposition plan ongoing.  Armandina Stammer, NP, PMHNP, FNP-BC. 02/09/2018, 2:42 PM   Agree with NP Progress Note
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
## 2018-02-09 NOTE — [facility] Group Notes (Signed)
Adult Psychoeducational Group Note  Date:  02/09/2018 Time:  1:19 PM  Group Topic/Focus:  Wellness Toolbox:   The focus of this group is to discuss various aspects of wellness, balancing those aspects and exploring ways to increase the ability to experience wellness.  Patients will create a wellness toolbox for use upon discharge.  Participation Level:  Active  Participation Quality:  Appropriate  Affect:  Appropriate  Cognitive:  Appropriate  Insight: Improving  Engagement in Group:  Engaged  Modes of Intervention:  Discussion  Additional Comments:    Donell BeersRodney S Destane Speas 02/09/2018, 1:19 PM

## 2018-02-09 NOTE — [facility] Group Notes (Signed)
[facility] LCSW Group Therapy Note  Date/Time:02/09/18, 1315  Type of Therapy and Topic:  Group Therapy:  Overcoming Obstacles  Participation Level: moderate   Description of Group:    In this group patients will be encouraged to explore what they see as obstacles to their own wellness and recovery. They will be guided to discuss their thoughts, feelings, and behaviors related to these obstacles. The group will process together ways to cope with barriers, with attention given to specific choices patients can make. Each patient will be challenged to identify changes they are motivated to make in order to overcome their obstacles. This group will be process-oriented, with patients participating in exploration of their own experiences as well as giving and receiving support and challenge from other group members.  Therapeutic Goals: 1. Patient will identify personal and current obstacles as they relate to admission. 2. Patient will identify barriers that currently interfere with their wellness or overcoming obstacles.  3. Patient will identify feelings, thought process and behaviors related to these barriers. 4. Patient will identify two changes they are willing to make to overcome these obstacles:    Summary of Patient Progress: Pt identified separation and coparenting as obstacles in his life.  Pt was active in group discussion regarding steps to overcome obstacles.        Therapeutic Modalities:   Cognitive Behavioral Therapy Solution Focused Therapy Motivational Interviewing Relapse Prevention Therapy  Daleen SquibbGreg Haiden Rawlinson, LCSW

## 2018-02-09 NOTE — Progress Notes (Signed)
Adult Psychoeducational Group Note  Date:  02/09/2018 Time:  8:55 PM  Group Topic/Focus:  Wrap-Up Group:   The focus of this group is to help patients review their daily goal of treatment and discuss progress on daily workbooks.  Participation Level:  Active  Participation Quality:  Appropriate  Affect:  Appropriate  Cognitive:  Appropriate  Insight: Appropriate  Engagement in Group:  Engaged  Modes of Intervention:  Discussion  Additional Comments: The patient expressed that he rates today a 9.The patient also said that he attend the Obstacle group. Octavio Mannshigpen, Charman Blasco Lee 02/09/2018, 8:55 PM

## 2018-02-09 NOTE — Progress Notes (Signed)
D: Patient appears bright and denies any depressive symptoms.  He denies HI/AVH and is stating he is not having any thoughts of self harm.  He reports he is sleeping and eating well; his energy level is high; concentration is good.  He is attending groups and participating in his treatment.  He is smiling and interacting well with his peers.    A: Continue to monitor medication management and MD orders.  Safety checks continued every 15 minutes per protocol.  Offer support and encouragement.    R: Patient is receptive to staff; his behavior is appropriate.

## 2018-02-10 DIAGNOSIS — F4325 Adjustment disorder with mixed disturbance of emotions and conduct: Secondary | ICD-10-CM

## 2018-02-10 DIAGNOSIS — F4320 Adjustment disorder, unspecified: Secondary | ICD-10-CM

## 2018-02-10 LAB — HEPATIC FUNCTION PANEL
ALT: 167 U/L — AB (ref 17–63)
AST: 94 U/L — AB (ref 15–41)
Albumin: 4.7 g/dL (ref 3.5–5.0)
Alkaline Phosphatase: 98 U/L (ref 38–126)
BILIRUBIN TOTAL: 1 mg/dL (ref 0.3–1.2)
Bilirubin, Direct: 0.2 mg/dL (ref 0.1–0.5)
Indirect Bilirubin: 0.8 mg/dL (ref 0.3–0.9)
Total Protein: 7.7 g/dL (ref 6.5–8.1)

## 2018-02-10 MED ORDER — INFLUENZA VAC SPLIT QUAD 0.5 ML IM SUSY
0.5000 mL | PREFILLED_SYRINGE | INTRAMUSCULAR | Status: DC
  Filled 2018-02-10: qty 0.5

## 2018-02-10 MED ORDER — PNEUMOCOCCAL VAC POLYVALENT 25 MCG/0.5ML IJ INJ: 0.5000 mL | INJECTION | INTRAMUSCULAR | Status: DC

## 2018-02-10 NOTE — [facility] Group Notes (Signed)
LCSW Group Therapy Note 02/10/2018 12:46 PM  Type of Therapy/Topic: Group Therapy: Feelings about Diagnosis  Participation Level: Minimal   Description of Group:  This group will allow patients to explore their thoughts and feelings about diagnoses they have received. Patients will be guided to explore their level of understanding and acceptance of these diagnoses. Facilitator will encourage patients to process their thoughts and feelings about the reactions of others to their diagnosis and will guide patients in identifying ways to discuss their diagnosis with significant others in their lives. This group will be process-oriented, with patients participating in exploration of their own experiences, giving and receiving support, and processing challenge from other group members.  Therapeutic Goals: 1. Patient will demonstrate understanding of diagnosis as evidenced by identifying two or more symptoms of the disorder 2. Patient will be able to express two feelings regarding the diagnosis 3. Patient will demonstrate their ability to communicate their needs through discussion and/or role play  Summary of Patient Progress:  Ricky Bond was attentive throughout the group's session, however he did not contribute to the group's discussion. Ricky Bond stayed the entire group session.      Therapeutic Modalities:  Cognitive Behavioral Therapy Brief Therapy Feelings Identification    Ricky Bond Catalina AntiguaWilliams LCSWA Clinical Social Worker

## 2018-02-10 NOTE — Progress Notes (Signed)
D: Patient denies any thoughts of self harm or any depressive symptoms.  Patient has his ex-wife and girlfriend down on his consent form and his ex-wife called this morning to get information about his care.  She states, "this is his wife.  I need to know what his diagnosis is and how he is doing.  I need to know when he is being discharged, as I will probably need to pick him up."  Patient was informed he appears to be doing well with his treatment.  I told her I was not sure when he will be discharged.  Patient is sleeping is eating well; his energy level is high and his concentration is good.  A: Continue to monitor medication management and MD orders.  Safety checks completed every 15 minutes per protocol.  Offer support and encouragement as needed.  R: Patient is receptive to staff; his behavior has been appropriate.

## 2018-02-10 NOTE — [facility] Suicide Risk Assessment (Signed)
[facility] INPATIENT:  Family/Significant Other Suicide Prevention Education  Suicide Prevention Education:  Education Completed; Felipa EthDana Truitt, girlfriend, [phone], has been identified by the patient as the family member/significant other with whom the patient will be residing, and identified as the person(s) who will aid the patient in the event of a mental health crisis (suicidal ideations/suicide attempt).  With written consent from the patient, the family member/significant other has been provided the following suicide prevention education, prior to the and/or following the discharge of the patient.  The suicide prevention education provided includes the following:  Suicide risk factors  Suicide prevention and interventions  National Suicide Hotline telephone number  [facility] assessment telephone number  [facility] Emergency Assistance 911  [facility] and/or Residential Mobile Crisis Unit telephone number  Request made of family/significant other to:  Remove weapons (e.g., guns, rifles, knives), all items previously/currently identified as safety concern.  No guns in the home, per BriarcliffDana.  Remove drugs/medications (over-the-counter, prescriptions, illicit drugs), all items previously/currently identified as a safety concern.  The family member/significant other verbalizes understanding of the suicide prevention education information provided.  The family member/significant other agrees to remove the items of safety concern listed above.  Annabelle HarmanDana reports pt is struggling with the end of his marriage.  No other stressors that she is aware of.  She does want him to have some mental health follow up and CSW told her this will be arranged.  Lorri FrederickWierda, Amity Roes Jon, LCSW 02/10/2018, 10:06 AM

## 2018-02-10 NOTE — Progress Notes (Signed)
[facility] MD Progress Note  02/10/2018 3:24 PM Ricky Bond  MRN:  [id number] Subjective:   32 y.o Caucasian male, separated, lives with a friend, employed. Background history of Depression. Presented to the ER following an OD. Found out his wife has started a new relationship. Could not handle it and attempted to end his own life by taking 18 pills of Tylenol.  Routine labs significant for mildly elevated liver enzymes. Toxicology is negative. No substances.  Chart reviewed today. Patient discussed at team today.  Staff reports that he has been doing good. He has not expressed any suicidal thoughts lately. He has not been observed to be withdrawn. He has been sleeping well at night. No behavioral issue  Seen today. Tells me that they have been separated for a couple of months. Says he allowed her to get to him emotionally. Says they were texting back and forth while he was on his way to work. Says he felt down and took the pills. He had sent her a picture of the pills in his hand. Says the police showed up at his work and brought him to the hospital. Patient says he has had time to think things through. Says he is remorseful as he has thought about the effects it would have had on his kids. He has a total of six kids. Had his first three from a previous marriage. Says they have been married for eight years. Patient says he wants to be there for his kids. Says he has decided to move on. He is already talking to another male. Patient says he wants to get back to his job. Notes that the medication has helped him a lot. Says his spirits are much better. Says his family can tell that he is better on medication. Feels his mind is working normally.  He is able to attend to task. No lingering suicidal thoughts. No homicidal thoughts. No thoughts of violence. Says he is sleeping well at night. Patient hopes he can be back at his work soon.   Principal Problem: MDD (major depressive disorder) Diagnosis:    Patient Active Problem List   Diagnosis Date Noted  . Intentional acetaminophen overdose (HCC) [T39.1X2A]   . MDD (major depressive disorder) [F32.9] 02/07/2018   Total Time spent with patient: 45 minutes  Past Psychiatric History: As in H&P  Past Medical History: History reviewed. No pertinent past medical history. History reviewed. No pertinent surgical history. Family History:  Family History  Family history unknown: Yes   Family Psychiatric  History: As in H&P Social History:  Social History   Substance and Sexual Activity  Alcohol Use No  . Frequency: Never     Social History   Substance and Sexual Activity  Drug Use Yes  . Types: Marijuana    Social History   Socioeconomic History  . Marital status: Married    Spouse name: None  . Number of children: None  . Years of education: None  . Highest education level: None  Social Needs  . Financial resource strain: None  . Food insecurity - worry: None  . Food insecurity - inability: None  . Transportation needs - medical: None  . Transportation needs - non-medical: None  Occupational History  . None  Tobacco Use  . Smoking status: Current Every Day Smoker    Packs/day: 1.00    Types: Cigarettes  . Smokeless tobacco: Never Used  Substance and Sexual Activity  . Alcohol use: No    Frequency: Never  .  Drug use: Yes    Types: Marijuana  . Sexual activity: Yes  Other Topics Concern  . None  Social History Narrative  . None   Additional Social History:    Pain Medications: see MAR Prescriptions: see MAR Over the Counter: see MAR Name of Substance 1: alcohol 1 - Frequency: occasional use Name of Substance 2: marijuana 2 - Frequency: pt reports use for tx of anxiety                Sleep: Good  Appetite:  Good  Current Medications: Current Facility-Administered Medications  Medication Dose Route Frequency Provider Last Rate Last Dose  . acetaminophen (TYLENOL) tablet 650 mg  650 mg Oral Q6H  PRN Okonkwo, Justina A, NP      . alum & mag hydroxide-simeth (MAALOX/MYLANTA) 200-200-20 MG/5ML suspension 30 mL  30 mL Oral Q4H PRN Okonkwo, Justina A, NP      . hydrOXYzine (ATARAX/VISTARIL) tablet 25 mg  25 mg Oral TID PRN Beryle Lathe, Justina A, NP   25 mg at 02/07/18 2216  . ibuprofen (ADVIL,MOTRIN) tablet 600 mg  600 mg Oral Q6H PRN Cobos, Rockey Situ, MD      . Melene Muller ON 02/11/2018] Influenza vac split quadrivalent PF (FLUARIX) injection 0.5 mL  0.5 mL Intramuscular Tomorrow-1000 Izediuno, Vincent A, MD      . magnesium hydroxide (MILK OF MAGNESIA) suspension 30 mL  30 mL Oral Daily PRN Okonkwo, Justina A, NP      . nicotine polacrilex (NICORETTE) gum 2 mg  2 mg Oral PRN Cobos, Rockey Situ, MD   2 mg at 02/09/18 2243  . [START ON 02/11/2018] pneumococcal 23 valent vaccine (PNU-IMMUNE) injection 0.5 mL  0.5 mL Intramuscular Tomorrow-1000 Izediuno, Vincent A, MD      . sertraline (ZOLOFT) tablet 50 mg  50 mg Oral Daily Oneta Rack, NP   50 mg at 02/10/18 0754  . traZODone (DESYREL) tablet 50 mg  50 mg Oral QHS PRN Beryle Lathe, Justina A, NP   50 mg at 02/07/18 2216    Lab Results:  Results for orders placed or performed during the hospital encounter of 02/07/18 (from the past 48 hour(s))  TSH     Status: None   Collection Time: 02/09/18  6:27 AM  Result Value Ref Range   TSH 2.853 0.350 - 4.500 uIU/mL    Comment: Performed by a 3rd Generation assay with a functional sensitivity of <=0.01 uIU/mL. Performed at [facility] [address], [place], [place] [address]   Hepatic function panel     Status: Abnormal   Collection Time: 02/09/18  6:27 AM  Result Value Ref Range   Total Protein 7.6 6.5 - 8.1 g/dL   Albumin 4.8 3.5 - 5.0 g/dL   AST 64 (H) 15 - 41 U/L   ALT 90 (H) 17 - 63 U/L   Alkaline Phosphatase 83 38 - 126 U/L   Total Bilirubin 1.0 0.3 - 1.2 mg/dL   Bilirubin, Direct 0.1 0.1 - 0.5 mg/dL   Indirect Bilirubin 0.9 0.3 - 0.9 mg/dL    Comment: Performed at [facility], [address] [place], [place] [address]  Hepatic function panel     Status: Abnormal   Collection Time: 02/10/18  7:09 AM  Result Value Ref Range   Total Protein 7.7 6.5 - 8.1 g/dL   Albumin 4.7 3.5 - 5.0 g/dL   AST 94 (H) 15 - 41 U/L   ALT 167 (H) 17 -  63 U/L   Alkaline Phosphatase 98 38 - 126 U/L   Total Bilirubin 1.0 0.3 - 1.2 mg/dL   Bilirubin, Direct 0.2 0.1 - 0.5 mg/dL   Indirect Bilirubin 0.8 0.3 - 0.9 mg/dL    Comment: Performed at [facility], [address] [place], [place] [address]    Blood Alcohol level:  No results found for: [facility]  Metabolic Disorder Labs: No results found for: HGBA1C, MPG No results found for: PROLACTIN No results found for: CHOL, TRIG, HDL, CHOLHDL, VLDL, LDLCALC  Physical Findings: AIMS: Facial and Oral Movements Muscles of Facial Expression: None, normal Lips and Perioral Area: None, normal Jaw: None, normal Tongue: None, normal,Extremity Movements Upper (arms, wrists, hands, fingers): None, normal Lower (legs, knees, ankles, toes): None, normal, Trunk Movements Neck, shoulders, hips: None, normal, Overall Severity Severity of abnormal movements (highest score from questions above): None, normal Incapacitation due to abnormal movements: None, normal Patient's awareness of abnormal movements (rate only patient's report): No Awareness, Dental Status Current problems with teeth and/or dentures?: No Does patient usually wear dentures?: No  CIWA:    COWS:     Musculoskeletal: Strength & Muscle Tone: within normal limits Gait & Station: normal Patient leans: N/A  Psychiatric Specialty Exam: Physical Exam  Constitutional: He is oriented to person, place, and time. He appears well-developed and well-nourished.  HENT:  Head: Normocephalic and atraumatic.  Respiratory: Effort normal.  Neurological: He is alert and oriented to person, place, and time.  Psychiatric:  As above     ROS   Blood pressure (!) 143/86, pulse 65, temperature 99.2 F (37.3 C), temperature source Oral, resp. rate 18, height 6' (1.829 m), weight 100.9 kg (222 lb 8 oz), SpO2 99 %.Body mass index is 30.18 kg/m.  General Appearance: Casually dressed, not crisply groomed. Good relatedness. Appropriate behavior.  Eye Contact:  Good  Speech:  Clear and Coherent and Normal Rate  Volume:  Normal  Mood:  Feels better  Affect:  Appropriate and Restricted  Thought Process:  Linear  Orientation:  Full (Time, Place, and Person)  Thought Content:  Future oriented. Less ruminations about his last marriage. No delusional theme. No preoccupation with violent thoughts.  No obsession.  No hallucination in any modality.   Suicidal Thoughts:  None currently  Homicidal Thoughts:  No  Memory:  Immediate;   Good Recent;   Good Remote;   Good  Judgement:  Good  Insight:  Good  Psychomotor Activity:  Normal  Concentration:  Concentration: Good and Attention Span: Good  Recall:  Good  Fund of Knowledge:  Good  Language:  Good  Akathisia:  Negative  Handed:    AIMS (if indicated):     Assets:  Communication Skills Desire for Improvement Financial Resources/Insurance Physical Health Resilience Transportation Vocational/Educational  ADL's:  Intact  Cognition:  WNL  Sleep:  Number of Hours: 6     Treatment Plan Summary: Patient is responding to treatment. Mood is lifting. He is processing transition into a new relationship. He does not express any dangerousness. His girlfriend, and family are supportive. We would evaluate him further. Hopeful discharge in a day or two.   Psychiatric: MDD Adjustment Disorder  Medical:  Psychosocial:  Separation with possibility of divorce Family dynamics   PLAN: 1. Continue Sertraline at current dose 2. Continue to encourage unit groups and therapeutic activity 3. Continue to monitor mood, behavior and interaction with peers  Georgiann CockerVincent A Izediuno, MD 02/10/2018,  3:24 PM

## 2018-02-10 NOTE — Progress Notes (Signed)
D: Pt appeared to be depressed, but stated that he had a "good day". Pt didn't go into discussion about his day. Pt has no questions or concerns.    A:  Support and encouragement was offered. 15 min checks continued for safety.  R: Pt remains safe.

## 2018-02-10 NOTE — [facility] Group Notes (Signed)
[facility] Group Notes:  (Nursing/MHT/Case Management/Adjunct)  Date:  02/10/2018  Time:  4:00 p.m.  Type of Therapy:  Psychoeducational Skills  Participation Level:  Active  Participation Quality:  Appropriate  Affect:  Appropriate  Cognitive:  Appropriate  Insight:  Appropriate  Engagement in Group:  Engaged  Modes of Intervention:  Education  Summary of Progress/Problems:  Group consisted of relaxation techniques.   Earline MayotteKnight, Tavaris Eudy Shephard 02/10/2018, 5:09 PM

## 2018-02-11 MED ORDER — TRAZODONE HCL 50 MG PO TABS: 50.0000 mg | ORAL_TABLET | Freq: Every evening | ORAL | 0 refills | Status: DC | PRN

## 2018-02-11 MED ORDER — HYDROXYZINE HCL 25 MG PO TABS: 25.0000 mg | ORAL_TABLET | Freq: Three times a day (TID) | ORAL | 0 refills | Status: DC | PRN

## 2018-02-11 MED ORDER — SERTRALINE HCL 50 MG PO TABS: 50.0000 mg | ORAL_TABLET | Freq: Every day | ORAL | 0 refills | Status: DC

## 2018-02-11 MED ORDER — NICOTINE POLACRILEX 2 MG MT GUM: 2.0000 mg | CHEWING_GUM | OROMUCOSAL | 0 refills | Status: DC | PRN

## 2018-02-11 NOTE — [facility] Suicide Risk Assessment (Addendum)
[facility]   Principal Problem: MDD (major depressive disorder) Discharge Diagnoses:  Patient Active Problem List   Diagnosis Date Noted  . Adjustment disorder, unspecified [F43.20] 02/10/2018  . Intentional acetaminophen overdose (HCC) [T39.1X2A]   . MDD (major depressive disorder) [F32.9] 02/07/2018    Total Time spent with patient: 45 minutes  Musculoskeletal: Strength & Muscle Tone: within normal limits Gait & Station: normal Patient leans: N/A  Psychiatric Specialty Exam: Review of Systems  Constitutional: Negative.   HENT: Negative.   Eyes: Negative.   Respiratory: Negative.   Cardiovascular: Negative.   Gastrointestinal: Negative.   Genitourinary: Negative.   Musculoskeletal: Negative.   Skin: Negative.   Neurological: Negative.   Endo/Heme/Allergies: Negative.   Psychiatric/Behavioral: Negative for depression, hallucinations, memory loss, substance abuse and suicidal ideas. The patient is not nervous/anxious and does not have insomnia.     Blood pressure 125/89, pulse (!) 55, temperature (!) 97.3 F (36.3 C), temperature source Oral, resp. rate 18, height 6' (1.829 m), weight 100.9 kg (222 lb 8 oz), SpO2 99 %.Body mass index is 30.18 kg/m.  General Appearance: Neatly dressed, pleasant, engaging well and cooperative. Appropriate behavior. Not in any distress. Good relatedness. Not internally stimulated.  Eye Contact::  Good  Speech:  Spontaneous, normal prosody. Normal tone and rate.   Volume:  Normal  Mood:  Euthymic  Affect:  Appropriate and Full Range  Thought Process:  Linear  Orientation:  Full (Time, Place, and Person)  Thought Content:  Future oriented, No delusional theme. No preoccupation with violent thoughts. No negative ruminations. No obsession.  No hallucination in any modality.   Suicidal Thoughts:  No  Homicidal Thoughts:  No  Memory:  Immediate;   Good Recent;   Good Remote;   Good  Judgement:  Good  Insight:  Good   Psychomotor Activity:  Normal  Concentration:  Good  Recall:  Good  Fund of Knowledge:Good  Language: Good  Akathisia:  Negative  Handed:    AIMS (if indicated):     Assets:  Communication Skills Desire for Improvement Financial Resources/Insurance Housing Intimacy Physical Health Resilience Social Support Talents/Skills Transportation Vocational/Educational  Sleep:  Number of Hours: 5.75  Cognition: WNL  ADL's:  Intact   Clinical Assessment::   32 y.o Caucasian male, separated, lives with a friend, employed. Background history of Depression. Presented to the ER following an OD. Found out his wife has started a new relationship. Could not handle it and attempted to end his own life by taking 18 pills of Tylenol.  Routine labs significant for mildly elevated liver enzymes. Toxicology is negative. No substances.  Seen today. Reports that he is in good spirits. Not feeling depressed. Reports normal energy and interest. Has been maintaining normal biological functions. He is able to think clearly. He is able to focus on task. His thoughts are not crowded or racing. No evidence of mania. No hallucination in any modality. He is not making any delusional statement. No passivity of will/thought. He is fully in touch with reality. No thoughts of suicide. No thoughts of homicide. No violent thoughts. No overwhelming anxiety.  Nursing staff reports that patient has been appropriate on the unit. Patient has been interacting well with peers. No behavioral issues. Patient has not voiced any suicidal thoughts. Patient has not been observed to be internally stimulated. Patient has been adherent with treatment recommendations. Patient has been tolerating their medication well.   Patient was discussed at team. Team members feels that patient is  back to his baseline level of function. Team agrees with plan to discharge patient today.  Demographic Factors:  Male, Divorced or widowed and  Caucasian  Loss Factors: Loss of significant relationship  Historical Factors: Impulsivity  Risk Reduction Factors:   Responsible for children under 25 years of age, Sense of responsibility to family, Employed, Living with another person, especially a relative, Positive social support, Positive therapeutic relationship and Positive coping skills or problem solving skills  Continued Clinical Symptoms:  As above  Cognitive Features That Contribute To Risk:  None    Suicide Risk:  Minimal: No identifiable suicidal ideation.  Patient is not having any thoughts of suicide at this time. Modifiable risk factors targeted during this admission includes depression and adjustment disorder. Demographical and historical risk factors cannot be modified. Patient is now engaging well. Patient is reliable and is future oriented. We have buffered patient's support structures. At this point, patient is at low risk of suicide. Patient is aware of the effects of psychoactive substances on decision making process. Patient has been provided with emergency contacts. Patient acknowledges to use resources provided if unforseen circumstances changes their current risk stratification.    Follow-up Information    [facility]. Go on 02/23/2018.   Why:  Please attend your medication appt with Dr Bayard Males on Monday, 02/23/18, at 10:00am. Contact information: [address] [place], [place] [address] P: [phone] F: [phone]          Plan Of Care/Follow-up recommendations:  1. Continue current psychotropic medications 2. Mental health and addiction follow up as arranged.  3. Discharge in care of hisfamily 4. Provided limited quantity of prescriptions   Georgiann Cocker, MD 02/11/2018, 8:51 AM

## 2018-02-11 NOTE — Progress Notes (Signed)
Adult Psychoeducational Group Note  Date:  02/11/2018 Time:  9:58 AM  Group Topic/Focus:  Goals Group:   The focus of this group is to help patients establish daily goals to achieve during treatment and discuss how the patient can incorporate goal setting into their daily lives to aide in recovery.  Participation Level:  Active  Participation Quality:  Appropriate  Affect:  Appropriate  Cognitive:  Alert  Insight: Appropriate  Engagement in Group:  Engaged  Modes of Intervention:  Discussion and Education  Additional Comments:  Pt was anticipating his discharge this afternoon. Pt stated " I feel a lot better now then when I came to the hospital, I'm glad I did come".  Ricky Bond E 02/11/2018, 9:58 AM

## 2018-02-11 NOTE — [facility] Group Notes (Signed)
[facility] Mental Health Association Group Therapy 02/11/2018 9:00am  Type of Therapy: Mental Health Association Presentation  Participation Level: Active  Participation Quality: Attentive  Affect: Appropriate  Cognitive: Oriented  Insight: Developing/Improving  Engagement in Therapy: Engaged  Modes of Intervention: Discussion, Education and Socialization  Summary of Progress/Problems: Mental Health Association (MHA) Speaker came to talk about his personal journey with mental health. The pt processed ways by which to relate to the speaker. MHA speaker provided handouts and educational information pertaining to groups and services offered by the MHA. Pt was engaged in speaker's presentation and was receptive to resources provided.    Krystn Dermody Jon, LCSW 02/11/2018 12:46 PM 

## 2018-02-11 NOTE — Progress Notes (Signed)
Pt received both written and verbal discharge instructions. Pt verbalized understanding of discharge instructions. Pt agreed to f/u appt and med regimen. Pt received discharge packet and prescriptions. Pt gathered belongings from room and locker. Pt safely discharged to the lobby.

## 2018-02-11 NOTE — Progress Notes (Signed)
Pt attend wrap up group. His day was a 8. His goal to clear his mind. He want to work to get things together. Pt said he ready to leave this place is not helping him. Pt said he witness how the other pt was treated and the nurses were laughing. The therapist and nurse are not professional. Pt said he's here because he has real issue he want to address to get better due to the staff not helping him get to the next level with his care. Pt said he now feel being here is a waste of his time. Why is he paying for something that not working and he realize this place is not to help him. Pt said he upset he did not go outside today. Pt said he was looking forward pet therapy. Pt said this facility lack structure. Pt said he is ready to leave. RN notify and she was able to come up with a solution.

## 2018-02-11 NOTE — Progress Notes (Signed)
D    Pt is pleasant on approach and cooperative    He denies SIand HI presently   He does endorse depression and anxiety   A    Verbal support given   Medications administered and effectiveness monitored    Q 15 min checks  R    Pt is safe at present time

## 2018-02-11 NOTE — Discharge Summary (Signed)
Physician Discharge Summary Note  Patient:  Ricky Bond is an 32 y.o., male MRN:  [id number] DOB:  18-Nov-1986 Patient phone:  [phone] (home)  Patient address:   [address] [place] [address],  Total Time spent with patient: 20 minutes  Date of Admission:  02/07/2018 Date of Discharge: 02/11/18  Reason for Admission:  Worsening depression with suicide attempt by overdose  Principal Problem: MDD (major depressive disorder) Discharge Diagnoses: Patient Active Problem List   Diagnosis Date Noted  . Adjustment disorder, unspecified [F43.20] 02/10/2018  . Intentional acetaminophen overdose (HCC) [T39.1X2A]   . MDD (major depressive disorder) [F32.9] 02/07/2018    Past Psychiatric History: Denies  Past Medical History: History reviewed. No pertinent past medical history. History reviewed. No pertinent surgical history. Family History:  Family History  Family history unknown: Yes   Family Psychiatric  History: Denies Social History:  Social History   Substance and Sexual Activity  Alcohol Use No  . Frequency: Never     Social History   Substance and Sexual Activity  Drug Use Yes  . Types: Marijuana    Social History   Socioeconomic History  . Marital status: Married    Spouse name: None  . Number of children: None  . Years of education: None  . Highest education level: None  Social Needs  . Financial resource strain: None  . Food insecurity - worry: None  . Food insecurity - inability: None  . Transportation needs - medical: None  . Transportation needs - non-medical: None  Occupational History  . None  Tobacco Use  . Smoking status: Current Every Day Smoker    Packs/day: 1.00    Types: Cigarettes  . Smokeless tobacco: Never Used  Substance and Sexual Activity  . Alcohol use: No    Frequency: Never  . Drug use: Yes    Types: Marijuana  . Sexual activity: Yes  Other Topics Concern  . None  Social History Narrative  . None     Hospital Course:   02/08/18 [facility] MD Assessment: Per assessment note- Ricky R Lightneris a 32 y.o.separatedmalewho presented to [facility] ED for drug overdose. Pt reports he is separated from his wife for several months and learned today that she is in a new relationship. Pt states it was an impulsive reaction to grab the pills and take an overdose of 10-18 pills (either Tylenol or Asprin- both are listed in record). Pt states he knew immediately that it was a poor decision and went to ED to get checked out. Pt reports he regrets taking the pills and that he has children to live for. Pt denies SI, HI &AVH. He denies hx of abuse. He has no hx of suicide attempts or inpt hospitalization. Pt reports he has anxiety but does not have a psychiatrist or therapist. On Evaluation: Ricky Bond is awake, alert and oriented.  Denies suicidal or homicidal ideation during this assessment. Denies auditory or visual hallucination and does not appear to be responding to internal stimuli.  Reports history of depression and this "event pushed me over the edge"  Report he is interested in taking medications.  Support, encouragement and reassurance was provided.   Patient remained on the [facility] unit for 3 days and stabilized with medication and therapy. Patient was started on Zoloft 50 mg Daily, Trazodone 50 mg QHS PRN, and Vistaril 25 mg TID PRN. The patient requests to continue Nicorette gun after discharge. Patient shows improvement with improved mod, sleep, affect, appetite, and  interaction. Patient has been seen in the day room interacting with peers and staff appropriately. He has been attending groups and participating. Patient denies any SI/HI/AVH and contracts for safety. [facility]. He is provided with prescriptions for his medications upon discharge.     Physical Findings: AIMS: Facial and Oral Movements Muscles of Facial Expression: None, normal Lips  and Perioral Area: None, normal Jaw: None, normal Tongue: None, normal,Extremity Movements Upper (arms, wrists, hands, fingers): None, normal Lower (legs, knees, ankles, toes): None, normal, Trunk Movements Neck, shoulders, hips: None, normal, Overall Severity Severity of abnormal movements (highest score from questions above): None, normal Incapacitation due to abnormal movements: None, normal Patient's awareness of abnormal movements (rate only patient's report): No Awareness, Dental Status Current problems with teeth and/or dentures?: No Does patient usually wear dentures?: No  CIWA:    COWS:     Musculoskeletal: Strength & Muscle Tone: within normal limits Gait & Station: normal Patient leans: N/A  Psychiatric Specialty Exam: Physical Exam  Nursing note and vitals reviewed. Constitutional: He is oriented to person, place, and time. He appears well-developed and well-nourished.  Respiratory: Effort normal.  Musculoskeletal: Normal range of motion.  Neurological: He is alert and oriented to person, place, and time.  Skin: Skin is warm.    Review of Systems  Constitutional: Negative.   HENT: Negative.   Eyes: Negative.   Respiratory: Negative.   Cardiovascular: Negative.   Gastrointestinal: Negative.   Genitourinary: Negative.   Musculoskeletal: Negative.   Skin: Negative.   Neurological: Negative.   Endo/Heme/Allergies: Negative.   Psychiatric/Behavioral: Negative.     Blood pressure 125/89, pulse (!) 55, temperature (!) 97.3 F (36.3 C), temperature source Oral, resp. rate 18, height 6' (1.829 m), weight 100.9 kg (222 lb 8 oz), SpO2 99 %.Body mass index is 30.18 kg/m.  General Appearance: Casual  Eye Contact:  Good  Speech:  Clear and Coherent and Normal Rate  Volume:  Normal  Mood:  Euthymic  Affect:  Appropriate  Thought Process:  Goal Directed and Descriptions of Associations: Intact  Orientation:  Full (Time, Place, and Person)  Thought Content:  WDL   Suicidal Thoughts:  No  Homicidal Thoughts:  No  Memory:  Immediate;   Good Recent;   Good Remote;   Good  Judgement:  Good  Insight:  Good  Psychomotor Activity:  Normal  Concentration:  Concentration: Good and Attention Span: Good  Recall:  Good  Fund of Knowledge:  Good  Language:  Good  Akathisia:  No  Handed:  Right  AIMS (if indicated):     Assets:  Communication Skills Desire for Improvement Financial Resources/Insurance Housing Physical Health Social Support Transportation  ADL's:  Intact  Cognition:  WNL  Sleep:  Number of Hours: 5.75     Have you used any form of tobacco in the last 30 days? (Cigarettes, Smokeless Tobacco, Cigars, and/or Pipes): Yes  Has this patient used any form of tobacco in the last 30 days? (Cigarettes, Smokeless Tobacco, Cigars, and/or Pipes) Yes, Yes, A prescription for an FDA-approved tobacco cessation medication was offered at discharge and the patient accepted  Blood Alcohol level:  No results found for: [facility]  Metabolic Disorder Labs:  No results found for: HGBA1C, MPG No results found for: PROLACTIN No results found for: CHOL, TRIG, HDL, CHOLHDL, VLDL, LDLCALC  See Psychiatric Specialty Exam and Suicide Risk Assessment completed by Attending Physician prior to discharge.  Discharge destination:  Home  Is patient on multiple antipsychotic therapies at discharge:  No   Has Patient had three or more failed trials of antipsychotic monotherapy by history:  No  Recommended Plan for Multiple Antipsychotic Therapies: NA   Allergies as of 02/11/2018   No Known Allergies     Medication List    STOP taking these medications   cyclobenzaprine 10 MG tablet Commonly known as:  FLEXERIL     TAKE these medications     Indication  hydrOXYzine 25 MG tablet Commonly known as:  ATARAX/VISTARIL Take 1 tablet (25 mg total) by mouth 3 (three) times daily as needed for anxiety.  Indication:  Feeling Anxious   nicotine polacrilex 2 MG  gum Commonly known as:  NICORETTE Take 1 each (2 mg total) by mouth as needed for smoking cessation.  Indication:  Nicotine Addiction   sertraline 50 MG tablet Commonly known as:  ZOLOFT Take 1 tablet (50 mg total) by mouth daily. For mood control Start taking on:  02/12/2018  Indication:  mood stability   traZODone 50 MG tablet Commonly known as:  DESYREL Take 1 tablet (50 mg total) by mouth at bedtime as needed for sleep.  Indication:  Trouble Sleeping      Follow-up Information    [facility]. Go on 02/23/2018.   Why:  Please attend your medication appt with Dr Bayard Males on Monday, 02/23/18, at 10:00am. Contact information: [address] [place], [place] [address] P: [phone] F: [phone]          Follow-up recommendations:  Continue activity as tolerated. Continue diet as recommended by your PCP. Ensure to keep all appointments with outpatient providers.  Comments:  Patient is instructed prior to discharge to: Take all medications as prescribed by his/her mental healthcare provider. Report any adverse effects and or reactions from the medicines to his/her outpatient provider promptly. Patient has been instructed & cautioned: To not engage in alcohol and or illegal drug use while on prescription medicines. In the event of worsening symptoms, patient is instructed to call the crisis hotline, 911 and or go to the nearest ED for appropriate evaluation and treatment of symptoms. To follow-up with his/her primary care provider for your other medical issues, concerns and or health care needs.    Signed: Gerlene Burdock Jourdan Maldonado, FNP 02/11/2018, 8:45 AM

## 2018-02-11 NOTE — [facility] Group Notes (Signed)
[facility] 02/11/2018 1:15pm  Type of Therapy: Mental Health Association Presentation  Participation Level: Active  Participation Quality: Attentive  Affect: Appropriate  Cognitive: Oriented  Insight: Developing/Improving  Engagement in Therapy: Engaged  Modes of Intervention: Discussion, Education and Socialization  Summary of Progress/Problems: Mental Health Association (MHA) Speaker came to talk about his personal journey with mental health. The pt processed ways by which to relate to the speaker. MHA speaker provided handouts and educational information pertaining to groups and services offered by the [facility]. Pt was engaged in speaker's presentation and was receptive to resources provided.    Pulte HomesHeather N Smart, LCSW 02/11/2018 9:33 AM

## 2018-02-11 NOTE — Progress Notes (Signed)
  [facility] :  Will you be returning to the same living situation after discharge:  Yes,  with girlfriend At discharge, do you have transportation home?: Yes,  girlfriend Do you have the ability to pay for your medications: Yes,  [facility]  Release of information consent forms completed and in the chart;  Patient's signature needed at discharge.  Patient to Follow up at: Follow-up Information    [facility]. Go on 02/23/2018.   Why:  Please attend your medication appt with Dr Bayard MalesGullapalli on Monday, 02/23/18, at 10:00am. Contact information: [address] [place], [place] [address] P: [phone] F: [phone]       Medicine, [facility]. Go on 02/17/2018.   Specialty:  Behavioral Health Why:  Please attend your intake appt with Laverna PeaceBrenda Blackwell on Tuesday, 02/17/18, at 3:00pm. Contact information: [address] [place] [address] [phone]           Next level of care provider [facility] to [facility] Link:no  Safety Planning and Suicide Prevention discussed: Yes,  with girlfriend  Have you used any form of tobacco in the last 30 days? (Cigarettes, Smokeless Tobacco, Cigars, and/or Pipes): Yes  Has patient been referred to the Quitline?: Patient refused referral  Patient has been referred for addiction treatment: Yes  Lorri FrederickWierda, Jaelynne Hockley Jon, LCSW 02/11/2018, 11:01 AM

## 2018-02-11 NOTE — Progress Notes (Signed)
Recreation Therapy Notes  Date: 02/11/18 Time: 0930 Location: [address] Dayroom  Group Topic: Stress Management  Goal Area(s) Addresses:  Patient will verbalize importance of using healthy stress management.  Patient will identify positive emotions associated with healthy stress management.   Intervention: Stress Management  Activity :  Progressive Muscle Relaxation.  LRT introduced the stress management technique of progressive muscle relaxation.  LRT read Bond script to guide the patients through the process of tensing and relaxing each muscle group individually.    Education:  Stress Management, Discharge Planning.   Education Outcome: Acknowledges edcuation/In group clarification offered/Needs additional education  Clinical Observations/Feedback: Pt did not attend group.     Ricky Bond, LRT/CTRS         Ricky RancherLindsay, Ricky Bond 02/11/2018 12:01 PM

## 2018-02-17 DIAGNOSIS — F332 Major depressive disorder, recurrent severe without psychotic features: Secondary | ICD-10-CM | POA: Diagnosis not present

## 2018-02-24 DIAGNOSIS — F332 Major depressive disorder, recurrent severe without psychotic features: Secondary | ICD-10-CM | POA: Diagnosis not present

## 2018-03-09 ENCOUNTER — Encounter (HOSPITAL_COMMUNITY): Payer: Self-pay | Admitting: Emergency Medicine

## 2018-03-09 ENCOUNTER — Emergency Department (HOSPITAL_COMMUNITY)
Admission: EM | Admit: 2018-03-09 | Discharge: 2018-03-09 | Disposition: A | Discharge status: Home / Self Care | Payer: 59 | Attending: Emergency Medicine | Admitting: Emergency Medicine

## 2018-03-09 ENCOUNTER — Emergency Department (HOSPITAL_COMMUNITY): Payer: 59

## 2018-03-09 ENCOUNTER — Other Ambulatory Visit: Payer: Self-pay

## 2018-03-09 DIAGNOSIS — N10 Acute tubulo-interstitial nephritis: Secondary | ICD-10-CM | POA: Insufficient documentation

## 2018-03-09 DIAGNOSIS — F17210 Nicotine dependence, cigarettes, uncomplicated: Secondary | ICD-10-CM | POA: Diagnosis not present

## 2018-03-09 DIAGNOSIS — Z79899 Other long term (current) drug therapy: Secondary | ICD-10-CM | POA: Insufficient documentation

## 2018-03-09 DIAGNOSIS — N12 Tubulo-interstitial nephritis, not specified as acute or chronic: Secondary | ICD-10-CM | POA: Diagnosis not present

## 2018-03-09 DIAGNOSIS — K8020 Calculus of gallbladder without cholecystitis without obstruction: Secondary | ICD-10-CM | POA: Diagnosis not present

## 2018-03-09 DIAGNOSIS — R109 Unspecified abdominal pain: Secondary | ICD-10-CM | POA: Diagnosis not present

## 2018-03-09 DIAGNOSIS — R319 Hematuria, unspecified: Secondary | ICD-10-CM | POA: Diagnosis not present

## 2018-03-09 LAB — GC/CHLAMYDIA PROBE AMP (~~LOC~~) NOT AT ARMC
Chlamydia: NEGATIVE
Neisseria Gonorrhea: NEGATIVE

## 2018-03-09 LAB — URINALYSIS, ROUTINE W REFLEX MICROSCOPIC
Bilirubin Urine: NEGATIVE
GLUCOSE, UA: NEGATIVE mg/dL
KETONES UR: NEGATIVE mg/dL
Nitrite: POSITIVE — AB
PH: 5 (ref 5.0–8.0)
Protein, ur: 30 mg/dL — AB
Specific Gravity, Urine: 1.017 (ref 1.005–1.030)

## 2018-03-09 LAB — CBC WITH DIFFERENTIAL/PLATELET
BASOS ABS: 0.1 10*3/uL (ref 0.0–0.1)
Basophils Relative: 1 %
EOS PCT: 2 %
Eosinophils Absolute: 0.2 10*3/uL (ref 0.0–0.7)
HEMATOCRIT: 47.6 % (ref 39.0–52.0)
Hemoglobin: 16.5 g/dL (ref 13.0–17.0)
LYMPHS ABS: 2.4 10*3/uL (ref 0.7–4.0)
LYMPHS PCT: 17 %
MCH: 32.4 pg (ref 26.0–34.0)
MCHC: 34.7 g/dL (ref 30.0–36.0)
MCV: 93.3 fL (ref 78.0–100.0)
Monocytes Absolute: 0.9 10*3/uL (ref 0.1–1.0)
Monocytes Relative: 6 %
NEUTROS ABS: 10.4 10*3/uL — AB (ref 1.7–7.7)
Neutrophils Relative %: 74 %
PLATELETS: 204 10*3/uL (ref 150–400)
RBC: 5.1 MIL/uL (ref 4.22–5.81)
RDW: 12.8 % (ref 11.5–15.5)
WBC: 14 10*3/uL — ABNORMAL HIGH (ref 4.0–10.5)

## 2018-03-09 LAB — BASIC METABOLIC PANEL
ANION GAP: 11 (ref 5–15)
BUN: 14 mg/dL (ref 6–20)
CHLORIDE: 103 mmol/L (ref 101–111)
CO2: 22 mmol/L (ref 22–32)
Calcium: 9.9 mg/dL (ref 8.9–10.3)
Creatinine, Ser: 1.04 mg/dL (ref 0.61–1.24)
GFR calc Af Amer: >60 mL/min (ref >60)
Glucose, Bld: 128 mg/dL — ABNORMAL HIGH (ref 65–99)
POTASSIUM: 4 mmol/L (ref 3.5–5.1)
Sodium: 136 mmol/L (ref 135–145)

## 2018-03-09 MED ORDER — KETOROLAC TROMETHAMINE 30 MG/ML IJ SOLN
15.0000 mg | Freq: Once | INTRAMUSCULAR | Status: AC
  Administered 2018-03-09: 15 mg via INTRAVENOUS
  Filled 2018-03-09: qty 1

## 2018-03-09 MED ORDER — ONDANSETRON HCL 4 MG/2ML IJ SOLN
4.0000 mg | Freq: Once | INTRAMUSCULAR | Status: AC
  Administered 2018-03-09: 4 mg via INTRAVENOUS
  Filled 2018-03-09: qty 2

## 2018-03-09 MED ORDER — AZITHROMYCIN 250 MG PO TABS
1000.0000 mg | ORAL_TABLET | Freq: Once | ORAL | Status: AC
  Administered 2018-03-09: 1000 mg via ORAL
  Filled 2018-03-09: qty 4

## 2018-03-09 MED ORDER — CEPHALEXIN 500 MG PO CAPS: 500.0000 mg | ORAL_CAPSULE | Freq: Three times a day (TID) | ORAL | 0 refills | Status: DC

## 2018-03-09 MED ORDER — SODIUM CHLORIDE 0.9 % IV BOLUS
1000.0000 mL | Freq: Once | INTRAVENOUS | Status: AC
  Administered 2018-03-09: 1000 mL via INTRAVENOUS

## 2018-03-09 MED ORDER — HYDROCODONE-ACETAMINOPHEN 5-325 MG PO TABS: 1.0000 | ORAL_TABLET | ORAL | 0 refills | Status: DC | PRN

## 2018-03-09 MED ORDER — MORPHINE SULFATE (PF) 4 MG/ML IV SOLN
4.0000 mg | Freq: Once | INTRAVENOUS | Status: AC
  Administered 2018-03-09: 4 mg via INTRAVENOUS
  Filled 2018-03-09: qty 1

## 2018-03-09 MED ORDER — SODIUM CHLORIDE 0.9 % IV SOLN
1.0000 g | Freq: Once | INTRAVENOUS | Status: AC
  Administered 2018-03-09: 1 g via INTRAVENOUS
  Filled 2018-03-09: qty 10

## 2018-03-09 NOTE — ED Triage Notes (Signed)
Pt reports lower back pain (new) and painful urination w/ blood starting yesterday.   Denies fevers, n/v/d.

## 2018-03-09 NOTE — Discharge Instructions (Addendum)
Your urine shows signs of infection.  This is likely a kidney infection.  Have been treated antibiotics.  Make sure that you follow-up in 2-3 days with a primary care for recheck.  Return sooner with any worsening symptoms including worsening pain, vomiting, fevers.  Your CAT scan did show signs of some gallstones in your gallbladder that is not causing her pain however to be aware of.  Make sure that you drink plenty of fluids stay hydrated and wash her kidneys.  Take antibiotic as prescribed.

## 2018-03-09 NOTE — ED Provider Notes (Signed)
[facility] EMERGENCY DEPARTMENT Provider Note   CSN: [id number] Arrival date & time: 03/09/18  0359     History   Chief Complaint Chief Complaint  Patient presents with  . Hematuria  . Back Pain    HPI Ricky Bond is a 32 y.o. male.  HPI 32 year old Caucasian male with no pertinent past medical history presents to the emergency department today for evaluation of bilateral flank pain and hematuria.  Patient states that his pain started yesterday.  Patient reports urgency and frequency along with dysuria.  Patient denies any associated fevers, nausea, vomiting or change in bowel habits.  Denies any abdominal pain.  The flank pain is bilateral and sharp in nature and does not radiate.  Pain is constant.  Denies any testicular pain or swelling.  No history of kidney stones.  Patient has not taken anything for his symptoms prior to arrival.  States he does have back problems at baseline however this feels different.  Patient denies any high risk behaviors including sex with men, anal sex.  She is circumcised.  Pt denies any fever, chill, ha, vision changes, lightheadedness, dizziness, congestion, neck pain, cp, sob, cough, abd pain, n/v/d, change in bowel habits, melena, hematochezia, lower extremity paresthesias.  History reviewed. No pertinent past medical history.  Patient Active Problem List   Diagnosis Date Noted  . Adjustment disorder, unspecified 02/10/2018  . Intentional acetaminophen overdose (HCC)   . MDD (major depressive disorder) 02/07/2018    History reviewed. No pertinent surgical history.      Home Medications    Prior to Admission medications   Medication Sig Start Date End Date Taking? Authorizing Provider  hydrOXYzine (ATARAX/VISTARIL) 25 MG tablet Take 1 tablet (25 mg total) by mouth 3 (three) times daily as needed for anxiety. 02/11/18   Money, Gerlene Burdock, FNP  nicotine polacrilex (NICORETTE) 2 MG gum Take 1 each (2 mg total) by mouth  as needed for smoking cessation. 02/11/18   Money, Gerlene Burdock, FNP  sertraline (ZOLOFT) 50 MG tablet Take 1 tablet (50 mg total) by mouth daily. For mood control 02/12/18   Money, Gerlene Burdock, FNP  traZODone (DESYREL) 50 MG tablet Take 1 tablet (50 mg total) by mouth at bedtime as needed for sleep. 02/11/18   Money, Gerlene Burdock, FNP    Family History Family History  Family history unknown: Yes    Social History Social History   Tobacco Use  . Smoking status: Current Every Day Smoker    Packs/day: 1.00    Types: Cigarettes  . Smokeless tobacco: Never Used  Substance Use Topics  . Alcohol use: No    Frequency: Never  . Drug use: Yes    Types: Marijuana     Allergies   Patient has no known allergies.   Review of Systems Review of Systems  All other systems reviewed and are negative.    Physical Exam Updated Vital Signs BP (!) 137/97 (BP Location: Right Arm)   Pulse 78   Temp 98.6 F (37 C) (Oral)   Resp 16   SpO2 98%   Physical Exam  Constitutional: He is oriented to person, place, and time. He appears well-developed and well-nourished.  Non-toxic appearance. No distress.  HENT:  Head: Normocephalic and atraumatic.  Mouth/Throat: Oropharynx is clear and moist.  Eyes: Pupils are equal, round, and reactive to light. Conjunctivae are normal. Right eye exhibits no discharge. Left eye exhibits no discharge.  Neck: Normal range of motion. Neck supple.  Cardiovascular: Normal rate, regular rhythm, normal heart sounds and intact distal pulses. Exam reveals no gallop and no friction rub.  No murmur heard. Pulmonary/Chest: Effort normal and breath sounds normal. No stridor. No respiratory distress. He has no wheezes. He has no rales. He exhibits no tenderness.  Abdominal: Soft. Bowel sounds are normal. He exhibits no distension. There is no tenderness. There is CVA tenderness (bilateral). There is no rigidity, no rebound, no guarding, no tenderness at McBurney's point and negative  Murphy's sign.  Genitourinary:  Genitourinary Comments: Chaperone present for exam. Circumcised male. No erythema, tenderness, lesion, or rash.  She has small amount of white discharge noted 2 descended testes without swelling, pain, lesions or rash. No inguinal lymphadenopathy or hernia.    Musculoskeletal: Normal range of motion. He exhibits no tenderness.  Lymphadenopathy:    He has no cervical adenopathy.  Neurological: He is alert and oriented to person, place, and time.  Skin: Skin is warm and dry. Capillary refill takes less than 2 seconds. No rash noted.  Psychiatric: His behavior is normal. Judgment and thought content normal.  Nursing note and vitals reviewed.  ED Treatments / Results  Labs (all labs ordered are listed, but only abnormal results are displayed) Labs Reviewed  URINALYSIS, ROUTINE W REFLEX MICROSCOPIC - Abnormal; Notable for the following components:      Result Value   APPearance HAZY (*)    Hgb urine dipstick LARGE (*)    Protein, ur 30 (*)    Nitrite POSITIVE (*)    Leukocytes, UA MODERATE (*)    Bacteria, UA MANY (*)    Squamous Epithelial / LPF 0-5 (*)    All other components within normal limits  BASIC METABOLIC PANEL - Abnormal; Notable for the following components:   Glucose, Bld 128 (*)    All other components within normal limits  CBC WITH DIFFERENTIAL/PLATELET - Abnormal; Notable for the following components:   WBC 14.0 (*)    Neutro Abs 10.4 (*)    All other components within normal limits  URINE CULTURE  GC/CHLAMYDIA PROBE AMP ([place]) NOT AT [facility]    EKG None  Radiology Ct Renal Stone Study  Result Date: 03/09/2018 CLINICAL DATA:  Low back pain and difficulty urinating EXAM: CT ABDOMEN AND PELVIS WITHOUT CONTRAST TECHNIQUE: Multidetector CT imaging of the abdomen and pelvis was performed following the standard protocol without IV contrast. COMPARISON:  None. FINDINGS: Lower chest: No acute abnormality. Hepatobiliary: Liver is within  normal limits. The gallbladder is well distended with a single dependent gallstone within. No complicating factors are seen. Pancreas: Unremarkable. No pancreatic ductal dilatation or surrounding inflammatory changes. Spleen: Normal in size without focal abnormality. Adrenals/Urinary Tract: Adrenal glands are unremarkable. Kidneys are normal, without renal calculi, focal lesion, or hydronephrosis. Bladder is unremarkable. Stomach/Bowel: Stomach is within normal limits. Appendix appears normal. No evidence of bowel wall thickening, distention, or inflammatory changes. Vascular/Lymphatic: No significant vascular findings are present. No enlarged abdominal or pelvic lymph nodes. Reproductive: Prostate is unremarkable. Other: No abdominal wall hernia or abnormality. No abdominopelvic ascites. Musculoskeletal: No acute or significant osseous findings. IMPRESSION: Cholelithiasis without complicating factors. No other focal abnormality is noted. Electronically Signed   By: Alcide Clever M.D.   On: 03/09/2018 08:10    Procedures Procedures (including critical care time)  Medications Ordered in ED Medications  sodium chloride 0.9 % bolus 1,000 mL (has no administration in time range)  morphine 4 MG/ML injection 4 mg (has no administration in time range)  ondansetron (ZOFRAN) injection 4 mg (has no administration in time range)  cefTRIAXone (ROCEPHIN) 1 g in sodium chloride 0.9 % 100 mL IVPB (has no administration in time range)     Initial Impression / Assessment and Plan / ED Course  I have reviewed the triage vital signs and the nursing notes.  Pertinent labs & imaging results that were available during my care of the patient were reviewed by me and considered in my medical decision making (see chart for details).     She presents to the ED with complaints of bilateral flank pain along with hematuria, dysuria urgency or frequency.  Denies any testicular pain or swelling.  Denies any high risk  behaviors.  Patient denies any penile discharge or testicular pain or swelling.  Patient is afebrile in the ED.  No tachycardia or hypotension is noted.  She has bilateral CVA tenderness but no abdominal tenderness.  Testicular exam shows no swelling or testicular pain.  Does have a small amount of discharge from the urethra.  No lymphadenopathy noted.  Lab work reveals a leukocytosis of 13,000.  Otherwise elect lites are reassuring.  Normal kidney function.  UA shows signs of infection with nitrite positive, many bacteria and WBCs with WBC clumps.  The renal stone study does not show any acute findings consistent with a kidney stone, hydronephrosis, stranding.  No signs of abscess formation.  Patient symptoms seem consistent with pyelonephritis.  Given that he is a male consider complicated.  A urine culture was sent.  This is pending at this time.  Patient given IV Rocephin in the ED.  Gonorrhea and Chlamydia cultures are pending however will treat patient with additional azithromycin to cover for any STD however have low suspicion at this time.  We will treat patient with Keflex for 10 days for pyelonephritis.  Patient is nontoxic or septic appearing.  Tolerating p.o. fluids or any intractable vomiting.  Pain controlled in the ED.  Feel the patient would be okay for outpatient therapy.  No indication for inpatient admission at this time.  Pt is hemodynamically stable, in NAD, & able to ambulate in the ED. Evaluation does not show pathology that would require ongoing emergent intervention or inpatient treatment. I explained the diagnosis to the patient. Pain has been managed & has no complaints prior to dc. Pt is comfortable with above plan and is stable for discharge at this time. All questions were answered prior to disposition. Strict return precautions for f/u to the ED were discussed. Encouraged follow up with PCP.  Discussed with my attending who is agreeable the above plan.  Final Clinical  Impressions(s) / ED Diagnoses   Final diagnoses:  Pyelonephritis    ED Discharge Orders        Ordered    cephALEXin (KEFLEX) 500 MG capsule  3 times daily     03/09/18 0934    HYDROcodone-acetaminophen (NORCO/VICODIN) 5-325 MG tablet  Every 4 hours PRN     03/09/18 0934       Rise MuLeaphart, Kenneth T, PA-C 03/09/18 [id number]    Tilden Fossaees, Elizabeth, MD 03/10/18 [phone]

## 2018-03-09 NOTE — ED Notes (Signed)
RN recollected Urine specimen for GC chlamydia and urine culture.

## 2018-03-11 LAB — URINE CULTURE: Culture: 70000 — AB

## 2018-03-12 ENCOUNTER — Telehealth: Payer: Self-pay | Admitting: *Deleted

## 2018-03-12 NOTE — Telephone Encounter (Signed)
Post ED Visit - Positive Culture Follow-up: Unsuccessful Patient Follow-up  Culture assessed and recommendations reviewed by:  []  Enzo BiNathan Batchelder, Pharm.D. []  Celedonio MiyamotoJeremy Frens, Pharm.D., BCPS AQ-ID []  Garvin FilaMike Maccia, Pharm.D., BCPS [x]  Georgina PillionElizabeth Martin, Pharm.D., BCPS []  HonesdaleMinh Pham, [address] BoulevardPharm.D., BCPS, AAHIVP []  Estella HuskMichelle Turner, Pharm.D., BCPS, AAHIVP []  Lysle Pearlachel Rumbarger, PharmD, BCPS []  Casilda Carlsaylor Stone, PharmD, BCPS []  Pollyann SamplesAndy Johnston, PharmD, BCPS  Positive urine culture  []  Patient discharged without antimicrobial prescription and treatment is now indicated [x]  Organism is resistant to prescribed ED discharge antimicrobial []  Patient with positive blood cultures  Patient needs ED evaluation and possible adimission for IV antibiotics   Unable to contact patient after 3 attempts, letter will be sent to address on file.  Left message on voicemail to [facility] to [facility].  Virl AxeRobertson, Auden Wettstein [facility] 03/12/2018, 10:40 AM

## 2018-03-12 NOTE — Progress Notes (Signed)
ED Antimicrobial Stewardship Positive Culture Follow Up   Ricky Bond is an 32 y.o. male who [facility] with a chief complaint of  Chief Complaint  Patient presents with  . Hematuria  . Back Pain    Recent Results (from the past 720 hour(s))  Urine culture     Status: Abnormal   Collection Time: 03/09/18  9:25 AM  Result Value Ref Range Status   Specimen Description URINE, RANDOM  Final   Special Requests NONE  Final   Culture (A)  Final    70,000 COLONIES/mL ESCHERICHIA COLI Confirmed Extended Spectrum Beta-Lactamase Producer (ESBL).  In bloodstream infections from ESBL organisms, carbapenems are preferred over piperacillin/tazobactam. They are shown to have a lower risk of mortality. Performed at [facility], [address] [place], [place] [address]    Report Status 03/11/2018 FINAL  Final   Organism ID, Bacteria ESCHERICHIA COLI (A)  Final      Susceptibility   Escherichia coli - MIC*    AMPICILLIN >=32 RESISTANT Resistant     CEFAZOLIN >=64 RESISTANT Resistant     CEFTRIAXONE >=64 RESISTANT Resistant     CIPROFLOXACIN >=4 RESISTANT Resistant     GENTAMICIN <=1 SENSITIVE Sensitive     IMIPENEM <=0.25 SENSITIVE Sensitive     NITROFURANTOIN <=16 SENSITIVE Sensitive     TRIMETH/SULFA >=320 RESISTANT Resistant     AMPICILLIN/SULBACTAM >=32 RESISTANT Resistant     PIP/TAZO 8 SENSITIVE Sensitive     Extended ESBL POSITIVE Resistant     * 70,000 COLONIES/mL ESCHERICHIA COLI    [x]  Treated with Keflex, organism resistant to prescribed antimicrobial and needs additional follow-up  The patient had symptoms of pyelonephritis with lower back pain and B/L flank pain. The culture grew ESBL E.coli with no po options that would be appropriate to treat a more complicated UTI picture. The patient should return to the ED for further evaluation and IV antibiotics.   ED Provider: Fayrene HelperBowie Tran, PA-C  Rolley SimsMartin, Layann Bluett Ann 03/12/2018, 9:31  AM Infectious Diseases Pharmacist Phone# [phone]

## 2018-03-31 ENCOUNTER — Other Ambulatory Visit: Payer: Self-pay

## 2018-03-31 ENCOUNTER — Encounter (HOSPITAL_COMMUNITY): Payer: Self-pay | Admitting: Emergency Medicine

## 2018-03-31 ENCOUNTER — Emergency Department (HOSPITAL_COMMUNITY): Payer: 59

## 2018-03-31 ENCOUNTER — Emergency Department (HOSPITAL_COMMUNITY)
Admission: EM | Admit: 2018-03-31 | Discharge: 2018-03-31 | Disposition: A | Discharge status: Home / Self Care | Payer: 59 | Attending: Emergency Medicine | Admitting: Emergency Medicine

## 2018-03-31 DIAGNOSIS — N3000 Acute cystitis without hematuria: Secondary | ICD-10-CM | POA: Diagnosis not present

## 2018-03-31 DIAGNOSIS — F17210 Nicotine dependence, cigarettes, uncomplicated: Secondary | ICD-10-CM | POA: Diagnosis not present

## 2018-03-31 DIAGNOSIS — N50811 Right testicular pain: Secondary | ICD-10-CM | POA: Diagnosis present

## 2018-03-31 DIAGNOSIS — N451 Epididymitis: Secondary | ICD-10-CM | POA: Diagnosis not present

## 2018-03-31 LAB — URINALYSIS, ROUTINE W REFLEX MICROSCOPIC
BILIRUBIN URINE: NEGATIVE
Glucose, UA: NEGATIVE mg/dL
HGB URINE DIPSTICK: NEGATIVE
Ketones, ur: NEGATIVE mg/dL
NITRITE: POSITIVE — AB
PROTEIN: NEGATIVE mg/dL
SPECIFIC GRAVITY, URINE: 1.012 (ref 1.005–1.030)
SQUAMOUS EPITHELIAL / LPF: NONE SEEN
pH: 5 (ref 5.0–8.0)

## 2018-03-31 MED ORDER — HYDROMORPHONE HCL 2 MG/ML IJ SOLN
1.0000 mg | Freq: Once | INTRAMUSCULAR | Status: AC
  Administered 2018-03-31: 1 mg via INTRAVENOUS
  Filled 2018-03-31: qty 1

## 2018-03-31 MED ORDER — HYDROCODONE-ACETAMINOPHEN 5-325 MG PO TABS: 1.0000 | ORAL_TABLET | ORAL | 0 refills | Status: DC | PRN

## 2018-03-31 MED ORDER — SODIUM CHLORIDE 0.9 % IV SOLN
1.0000 g | Freq: Once | INTRAVENOUS | Status: AC
  Administered 2018-03-31: 1 g via INTRAVENOUS
  Filled 2018-03-31: qty 10

## 2018-03-31 MED ORDER — NITROFURANTOIN MONOHYD MACRO 100 MG PO CAPS: 100.0000 mg | ORAL_CAPSULE | Freq: Two times a day (BID) | ORAL | 0 refills | Status: DC

## 2018-03-31 MED ORDER — NAPROXEN 500 MG PO TABS: 500.0000 mg | ORAL_TABLET | Freq: Two times a day (BID) | ORAL | 0 refills | Status: DC

## 2018-03-31 MED ORDER — DOXYCYCLINE HYCLATE 100 MG PO CAPS: 100.0000 mg | ORAL_CAPSULE | Freq: Two times a day (BID) | ORAL | 0 refills | Status: DC

## 2018-03-31 MED ORDER — KETOROLAC TROMETHAMINE 30 MG/ML IJ SOLN
30.0000 mg | Freq: Once | INTRAMUSCULAR | Status: AC
  Administered 2018-03-31: 30 mg via INTRAVENOUS
  Filled 2018-03-31: qty 1

## 2018-03-31 MED ORDER — AZITHROMYCIN 250 MG PO TABS
1000.0000 mg | ORAL_TABLET | Freq: Once | ORAL | Status: AC
  Administered 2018-03-31: 1000 mg via ORAL
  Filled 2018-03-31: qty 4

## 2018-03-31 MED ORDER — HYDROMORPHONE HCL 2 MG/ML IJ SOLN
1.0000 mg | Freq: Once | INTRAMUSCULAR | Status: AC
  Administered 2018-03-31: 1 mg via INTRAMUSCULAR
  Filled 2018-03-31: qty 1

## 2018-03-31 NOTE — ED Notes (Signed)
Pt brought back to treatment room from US.

## 2018-03-31 NOTE — ED Notes (Signed)
Patient able to ambulate independently  

## 2018-03-31 NOTE — ED Triage Notes (Signed)
Patient complains of right testicle pain and swelling that started yesterday. Patient reports yesterday it was a mild discomfort, but today pain is becoming unbearable.

## 2018-03-31 NOTE — ED Provider Notes (Signed)
[facility] EMERGENCY DEPARTMENT Provider Note   CSN: [id number] Arrival date & time: 03/31/18  1003     History   Chief Complaint Chief Complaint  Patient presents with  . Testicle Pain    HPI Ricky Bond is a 32 y.o. male.  HPI Patient presented to the emergency room for evaluation of right testicular pain.  Patient states he started having some aching discomfort in his right testicle yesterday evening.  This morning when he woke up and went to the bathroom he noted that his right testicle appeared swollen and was very tender.  His pain has increased in severity.  Patient denies any discharge.  No recent injuries.  No vomiting but he does feel nauseous when the pain is severe.  Movement and palpation increases the pain. History reviewed. No pertinent past medical history.  Patient Active Problem List   Diagnosis Date Noted  . Adjustment disorder, unspecified 02/10/2018  . Intentional acetaminophen overdose (HCC)   . MDD (major depressive disorder) 02/07/2018    History reviewed. No pertinent surgical history.      Home Medications    Prior to Admission medications   Medication Sig Start Date End Date Taking? Authorizing Provider  cephALEXin (KEFLEX) 500 MG capsule Take 1 capsule (500 mg total) by mouth 3 (three) times daily. Patient not taking: Reported on 03/31/2018 03/09/18   Demetrios Loll T, PA-C  doxycycline (VIBRAMYCIN) 100 MG capsule Take 1 capsule (100 mg total) by mouth 2 (two) times daily. 03/31/18   Linwood Dibbles, MD  HYDROcodone-acetaminophen (NORCO/VICODIN) 5-325 MG tablet Take 1 tablet by mouth every 4 (four) hours as needed. 03/31/18   Linwood Dibbles, MD  hydrOXYzine (ATARAX/VISTARIL) 25 MG tablet Take 1 tablet (25 mg total) by mouth 3 (three) times daily as needed for anxiety. Patient not taking: Reported on 03/31/2018 02/11/18   Money, Gerlene Burdock, FNP  naproxen (NAPROSYN) 500 MG tablet Take 1 tablet (500 mg total) by mouth 2 (two) times  daily. 03/31/18   Linwood Dibbles, MD  nicotine polacrilex (NICORETTE) 2 MG gum Take 1 each (2 mg total) by mouth as needed for smoking cessation. Patient not taking: Reported on 03/31/2018 02/11/18   Money, Gerlene Burdock, FNP  nitrofurantoin, macrocrystal-monohydrate, (MACROBID) 100 MG capsule Take 1 capsule (100 mg total) by mouth 2 (two) times daily. 03/31/18   Linwood Dibbles, MD  sertraline (ZOLOFT) 50 MG tablet Take 1 tablet (50 mg total) by mouth daily. For mood control Patient not taking: Reported on 03/31/2018 02/12/18   Money, Gerlene Burdock, FNP  traZODone (DESYREL) 50 MG tablet Take 1 tablet (50 mg total) by mouth at bedtime as needed for sleep. Patient not taking: Reported on 03/31/2018 02/11/18   Money, Gerlene Burdock, FNP    Family History Family History  Family history unknown: Yes    Social History Social History   Tobacco Use  . Smoking status: Current Every Day Smoker    Packs/day: 1.00    Types: Cigarettes  . Smokeless tobacco: Never Used  Substance Use Topics  . Alcohol use: No    Frequency: Never  . Drug use: Yes    Types: Marijuana     Allergies   Patient has no known allergies.   Review of Systems Review of Systems  All other systems reviewed and are negative.    Physical Exam Updated Vital Signs BP 110/69   Pulse (!) 55   Temp 98.2 F (36.8 C)   Resp 16   SpO2 94%  Physical Exam  Constitutional: He appears well-developed and well-nourished. He appears distressed.  HENT:  Head: Normocephalic and atraumatic.  Right Ear: External ear normal.  Left Ear: External ear normal.  Eyes: Conjunctivae are normal. Right eye exhibits no discharge. Left eye exhibits no discharge. No scleral icterus.  Neck: Neck supple. No tracheal deviation present.  Cardiovascular: Normal rate, regular rhythm and intact distal pulses.  Pulmonary/Chest: Effort normal and breath sounds normal. No stridor. No respiratory distress. He has no wheezes. He has no rales.  Abdominal: Soft. Bowel sounds  are normal. He exhibits no distension. There is no tenderness. There is no rebound and no guarding. No hernia. Hernia confirmed negative in the right inguinal area and confirmed negative in the left inguinal area.  Genitourinary: Penis normal. Right testis shows swelling and tenderness. Left testis shows no mass, no swelling and no tenderness.  Musculoskeletal: He exhibits no edema or tenderness.  Neurological: He is alert. He has normal strength. No cranial nerve deficit (no facial droop, extraocular movements intact, no slurred speech) or sensory deficit. He exhibits normal muscle tone. He displays no seizure activity. Coordination normal.  Skin: Skin is warm and dry. No rash noted.  Psychiatric: He has a normal mood and affect.  Nursing note and vitals reviewed.    ED Treatments / Results  Labs (all labs ordered are listed, but only abnormal results are displayed) Labs Reviewed  URINALYSIS, ROUTINE W REFLEX MICROSCOPIC - Abnormal; Notable for the following components:      Result Value   Nitrite POSITIVE (*)    Leukocytes, UA SMALL (*)    Bacteria, UA MANY (*)    All other components within normal limits  RPR  HIV ANTIBODY (ROUTINE TESTING)  GC/CHLAMYDIA PROBE AMP ([place]) NOT AT [facility]    EKG None  Radiology US Scrotum W/doppler  Result Date: 03/31/2018 CLINICAL DATA:  Right testicular pain for 2 days EXAM: SCROTAL ULTRASOUND DOPPLER ULTRASOUND OF THE TESTICLES TECHNIQUE: Complete ultrasound examination of the testicles, epididymis, and other scrotal structures was performed. Color and spectral Doppler ultrasound were also utilized to evaluate blood flow to the testicles. COMPARISON:  None. FINDINGS: Right testicle Measurements: 49 x 30 x 36 mm. Negative for mass. Probable comparative hypervascularity. Left testicle Measurements: 52 x 28 x 36 mm. No mass or microlithiasis visualized. Right epididymis:  Thickened tail with increased vascular flow. Left epididymis:  Normal in size  and appearance. Hydrocele:  None visualized. Varicocele:  None visualized. Pulsed Doppler interrogation of both testes demonstrates normal low resistance arterial and venous waveforms bilaterally. IMPRESSION: Right epididymitis with probable mild right orchitis. Electronically Signed   By: Marnee Spring M.D.   On: 03/31/2018 11:56    Procedures Procedures (including critical care time)  Medications Ordered in ED Medications  HYDROmorphone (DILAUDID) injection 1 mg (1 mg Intramuscular Given 03/31/18 1107)  azithromycin (ZITHROMAX) tablet 1,000 mg (1,000 mg Oral Given 03/31/18 1248)  cefTRIAXone (ROCEPHIN) 1 g in sodium chloride 0.9 % 100 mL IVPB (0 g Intravenous Stopped 03/31/18 1441)  ketorolac (TORADOL) 30 MG/ML injection 30 mg (30 mg Intravenous Given 03/31/18 1248)  HYDROmorphone (DILAUDID) injection 1 mg (1 mg Intravenous Given 03/31/18 1449)     Initial Impression / Assessment and Plan / ED Course  I have reviewed the triage vital signs and the nursing notes.  Pertinent labs & imaging results that were available during my care of the patient were reviewed by me and considered in my medical decision making (see chart for details).  Clinical Course as of Mar 31 1552  Tue Mar 31, 2018  1432 Reviewed previous visit with pt.  PT had a uti with positive urine culture.  Bacteria was resistant to his dc abx   [JK]    Clinical Course User Index [JK] Linwood DibblesKnapp, Tyquasia Pant, MD    Patient presented to the emergency room for evaluation of testicular pain.  On exam the patient had a tender and swollen right testicle.  Ultrasound did not show any evidence of torsion.  He appears to have an epididymitis/orchitis.  Patient was treated with antibiotics and pain medications.  Patient was actually seen in the emergency room on March 25.  At that time he was diagnosed with a urinary tract infection/possible pyelonephritis.  Patient was discharged home on Keflex.  Patient's urine culture however showed E. coli that  was resistant to multiple organisms including the Keflex.  Will rx Macrobid to cover for the previous UTI as well as doxycycline for possible STD related epididymitis orchitis.  Patient did have GC chlamydia results the previous visit and they were negative however. Final Clinical Impressions(s) / ED Diagnoses   Final diagnoses:  Epididymitis  Acute cystitis without hematuria    ED Discharge Orders        Ordered    nitrofurantoin, macrocrystal-monohydrate, (MACROBID) 100 MG capsule  2 times daily     03/31/18 1547    doxycycline (VIBRAMYCIN) 100 MG capsule  2 times daily     03/31/18 1547    HYDROcodone-acetaminophen (NORCO/VICODIN) 5-325 MG tablet  Every 4 hours PRN     03/31/18 1547    naproxen (NAPROSYN) 500 MG tablet  2 times daily     03/31/18 1547       Linwood DibblesKnapp, Jax Kentner, MD 03/31/18 1553

## 2018-03-31 NOTE — ED Provider Notes (Signed)
Patient placed in Quick Look pathway, seen and evaluated   Chief Complaint: R testicular pain, swelling  HPI:   Patient reports onset of right testicle pain and swelling that began yesterday.  Symptoms were unprovoked.  States that pain is "unbearable" today.  No previous history of similar symptoms in the past.  Denies any fevers.  ROS: Testicular pain  Physical Exam:   Gen: No distress  Neuro: Awake and Alert  Skin: Warm    Focused Exam: Right testicle enlarged and tender to touch.  RN served as Occupational hygienistchaperone during examination.  Will order IM pain medication, urinalysis and order ultrasound to rule out torsion.   Initiation of care has begun. The patient has been counseled on the process, plan, and necessity for staying for the completion/evaluation, and the remainder of the medical screening examination    Dietrich PatesKhatri, Kyrese Gartman, PA-C 03/31/18 1103    Cathren LaineSteinl, Kevin, MD 04/01/18 1258

## 2018-03-31 NOTE — Discharge Instructions (Signed)
Take the antibiotics as prescribed, follow-up with the urologist or primary care doctor to make sure the infection resolves, return for fever worsening symptoms

## 2018-04-01 LAB — HIV ANTIBODY (ROUTINE TESTING W REFLEX): HIV SCREEN 4TH GENERATION: NONREACTIVE

## 2018-04-01 LAB — RPR: RPR: NONREACTIVE

## 2018-04-07 DIAGNOSIS — N453 Epididymo-orchitis: Secondary | ICD-10-CM | POA: Diagnosis not present

## 2018-06-30 DIAGNOSIS — F39 Unspecified mood [affective] disorder: Secondary | ICD-10-CM | POA: Diagnosis not present

## 2018-06-30 DIAGNOSIS — F331 Major depressive disorder, recurrent, moderate: Secondary | ICD-10-CM | POA: Diagnosis not present

## 2018-06-30 DIAGNOSIS — Z7251 High risk heterosexual behavior: Secondary | ICD-10-CM | POA: Diagnosis not present

## 2018-06-30 DIAGNOSIS — F411 Generalized anxiety disorder: Secondary | ICD-10-CM | POA: Diagnosis not present

## 2018-08-22 ENCOUNTER — Encounter (HOSPITAL_COMMUNITY): Payer: Self-pay | Admitting: *Deleted

## 2018-08-22 ENCOUNTER — Emergency Department (HOSPITAL_COMMUNITY)
Admission: EM | Admit: 2018-08-22 | Discharge: 2018-08-22 | Disposition: A | Discharge status: Home / Self Care | Payer: 59 | Attending: Emergency Medicine | Admitting: Emergency Medicine

## 2018-08-22 ENCOUNTER — Other Ambulatory Visit: Payer: Self-pay

## 2018-08-22 DIAGNOSIS — S39012A Strain of muscle, fascia and tendon of lower back, initial encounter: Secondary | ICD-10-CM | POA: Diagnosis not present

## 2018-08-22 DIAGNOSIS — Y998 Other external cause status: Secondary | ICD-10-CM | POA: Diagnosis not present

## 2018-08-22 DIAGNOSIS — S3992XA Unspecified injury of lower back, initial encounter: Secondary | ICD-10-CM | POA: Diagnosis present

## 2018-08-22 DIAGNOSIS — Y9389 Activity, other specified: Secondary | ICD-10-CM | POA: Insufficient documentation

## 2018-08-22 DIAGNOSIS — F17210 Nicotine dependence, cigarettes, uncomplicated: Secondary | ICD-10-CM | POA: Insufficient documentation

## 2018-08-22 DIAGNOSIS — Y33XXXA Other specified events, undetermined intent, initial encounter: Secondary | ICD-10-CM | POA: Diagnosis not present

## 2018-08-22 DIAGNOSIS — Z79899 Other long term (current) drug therapy: Secondary | ICD-10-CM | POA: Insufficient documentation

## 2018-08-22 DIAGNOSIS — Y929 Unspecified place or not applicable: Secondary | ICD-10-CM | POA: Insufficient documentation

## 2018-08-22 MED ORDER — METHOCARBAMOL 500 MG PO TABS: 500.0000 mg | ORAL_TABLET | Freq: Two times a day (BID) | ORAL | 0 refills | Status: DC

## 2018-08-22 MED ORDER — MELOXICAM 15 MG PO TABS: 15.0000 mg | ORAL_TABLET | Freq: Every day | ORAL | 0 refills | Status: DC

## 2018-08-22 MED ORDER — KETOROLAC TROMETHAMINE 30 MG/ML IJ SOLN
30.0000 mg | Freq: Once | INTRAMUSCULAR | Status: AC
  Administered 2018-08-22: 30 mg via INTRAMUSCULAR
  Filled 2018-08-22: qty 1

## 2018-08-22 MED ORDER — DICLOFENAC SODIUM 1 % TD GEL: 2.0000 g | Freq: Four times a day (QID) | TRANSDERMAL | 0 refills | Status: DC

## 2018-08-22 MED ORDER — OXYCODONE-ACETAMINOPHEN 5-325 MG PO TABS
1.0000 | ORAL_TABLET | Freq: Once | ORAL | Status: AC
  Administered 2018-08-22: 1 via ORAL
  Filled 2018-08-22: qty 1

## 2018-08-22 NOTE — Discharge Instructions (Addendum)
Return to ED for worsening symptoms, numbness in arms or legs, injuries or falls, chest pain, trouble walking.

## 2018-08-22 NOTE — ED Notes (Signed)
Bed: WA01 Expected date:  Expected time:  Means of arrival:  Comments: 

## 2018-08-22 NOTE — ED Provider Notes (Signed)
[facility]-EMERGENCY DEPT Provider Note   CSN: [id number] Arrival date & time: 08/22/18  2056     History   Chief Complaint Chief Complaint  Patient presents with  . Back Pain    HPI Ricky Bond is a 32 y.o. male with no significant past medical history presents to ED for evaluation of 7-hour history of lower back pain.  Describes the pain is aching.  Radiates to his mid back.  It began when he was placing a motor onto the ground earlier today.  He felt a "snap" in his back.  He has had intermittent back pain in the past but states that it has never been this bad.  He reports no improvement with 2 Aleve.  He denies any prior back surgeries, history of cancer, history of IV drug use, saddle anesthesia, numbness in legs, dysuria, loss of bowel or bladder function, fever, chest pain.  HPI  History reviewed. No pertinent past medical history.  Patient Active Problem List   Diagnosis Date Noted  . Adjustment disorder, unspecified 02/10/2018  . Intentional acetaminophen overdose (HCC)   . MDD (major depressive disorder) 02/07/2018    History reviewed. No pertinent surgical history.      Home Medications    Prior to Admission medications   Medication Sig Start Date End Date Taking? Authorizing Provider  cephALEXin (KEFLEX) 500 MG capsule Take 1 capsule (500 mg total) by mouth 3 (three) times daily. Patient not taking: Reported on 03/31/2018 03/09/18   Demetrios Loll T, PA-C  diclofenac sodium (VOLTAREN) 1 % GEL Apply 2 g topically 4 (four) times daily. 08/22/18   Anden Bartolo, PA-C  doxycycline (VIBRAMYCIN) 100 MG capsule Take 1 capsule (100 mg total) by mouth 2 (two) times daily. 03/31/18   Linwood Dibbles, MD  HYDROcodone-acetaminophen (NORCO/VICODIN) 5-325 MG tablet Take 1 tablet by mouth every 4 (four) hours as needed. 03/31/18   Linwood Dibbles, MD  hydrOXYzine (ATARAX/VISTARIL) 25 MG tablet Take 1 tablet (25 mg total) by mouth 3 (three) times daily as needed  for anxiety. Patient not taking: Reported on 03/31/2018 02/11/18   Money, Gerlene Burdock, FNP  meloxicam (MOBIC) 15 MG tablet Take 1 tablet (15 mg total) by mouth daily. 08/22/18   Shayden Gingrich, PA-C  methocarbamol (ROBAXIN) 500 MG tablet Take 1 tablet (500 mg total) by mouth 2 (two) times daily. 08/22/18   Bee Marchiano, PA-C  naproxen (NAPROSYN) 500 MG tablet Take 1 tablet (500 mg total) by mouth 2 (two) times daily. 03/31/18   Linwood Dibbles, MD  nicotine polacrilex (NICORETTE) 2 MG gum Take 1 each (2 mg total) by mouth as needed for smoking cessation. Patient not taking: Reported on 03/31/2018 02/11/18   Money, Gerlene Burdock, FNP  nitrofurantoin, macrocrystal-monohydrate, (MACROBID) 100 MG capsule Take 1 capsule (100 mg total) by mouth 2 (two) times daily. 03/31/18   Linwood Dibbles, MD  traZODone (DESYREL) 50 MG tablet Take 1 tablet (50 mg total) by mouth at bedtime as needed for sleep. Patient not taking: Reported on 03/31/2018 02/11/18   Money, Gerlene Burdock, FNP    Family History Family History  Family history unknown: Yes    Social History Social History   Tobacco Use  . Smoking status: Current Every Day Smoker    Packs/day: 1.00    Types: Cigarettes  . Smokeless tobacco: Never Used  Substance Use Topics  . Alcohol use: No    Frequency: Never  . Drug use: Yes    Types: Marijuana  Allergies   Patient has no known allergies.   Review of Systems Review of Systems  Constitutional: Negative for chills and fever.  Cardiovascular: Negative for chest pain.  Genitourinary: Negative for dysuria.  Musculoskeletal: Positive for back pain and myalgias.  Skin: Negative for wound.  Neurological: Negative for weakness and numbness.     Physical Exam Updated Vital Signs BP 131/82 (BP Location: Right Arm)   Pulse (!) 58   Temp 98.1 F (36.7 C) (Oral)   Resp 18   SpO2 98%   Physical Exam  Constitutional: He appears well-developed and well-nourished. No distress.  Appears uncomfortable but ambulatory.    HENT:  Head: Normocephalic and atraumatic.  Eyes: Conjunctivae and EOM are normal. No scleral icterus.  Neck: Normal range of motion.  Pulmonary/Chest: Effort normal. No respiratory distress.  Musculoskeletal:       Back:  Tenderness palpation of the lumbar spine at the midline paraspinal musculature. No midline spinal tenderness present in thoracic or cervical spine. No step-off palpated. No visible bruising, edema or temperature change noted. No objective signs of numbness present. No saddle anesthesia. 2+ DP pulses bilaterally. Sensation intact to light touch. Strength 5/5 in bilateral lower extremities.  Neurological: He is alert.  Skin: No rash noted. He is not diaphoretic.  Psychiatric: He has a normal mood and affect.  Nursing note and vitals reviewed.    ED Treatments / Results  Labs (all labs ordered are listed, but only abnormal results are displayed) Labs Reviewed - No data to display  EKG None  Radiology No results found.  Procedures Procedures (including critical care time)  Medications Ordered in ED Medications  oxyCODONE-acetaminophen (PERCOCET/ROXICET) 5-325 MG per tablet 1 tablet (has no administration in time range)  ketorolac (TORADOL) 30 MG/ML injection 30 mg (has no administration in time range)     Initial Impression / Assessment and Plan / ED Course  I have reviewed the triage vital signs and the nursing notes.  Pertinent labs & imaging results that were available during my care of the patient were reviewed by me and considered in my medical decision making (see chart for details).     Patient denies any concerning symptoms suggestive of cauda equina requiring urgent imaging at this time such as loss of sensation in the lower extremities, lower extremity weakness, loss of bowel or bladder control, saddle anesthesia, urinary retention, fever/chills, IVDU. Exam demonstrated no  weakness on exam today. No preceding injury or trauma to suggest acute  fracture. Doubt pelvic or urinary pathology for patient's acute back pain, as patient denies urinary symptoms, history/pain not consistent with nephrolithiasis, Doubt AAA as cause of patient's back pain as patient lacks major risk factors, and has symmetric and intact distal pulses. Patient given strict return precautions for any symptoms indicating worsening neurologic function in the lower extremities.  Portions of this note were generated with Scientist, clinical (histocompatibility and immunogenetics). Dictation errors may occur despite best attempts at proofreading.   Final Clinical Impressions(s) / ED Diagnoses   Final diagnoses:  Strain of lumbar region, initial encounter    ED Discharge Orders         Ordered    methocarbamol (ROBAXIN) 500 MG tablet  2 times daily     08/22/18 2336    diclofenac sodium (VOLTAREN) 1 % GEL  4 times daily     08/22/18 2336    meloxicam (MOBIC) 15 MG tablet  Daily     08/22/18 2336  Dietrich Pates, PA-C 08/22/18 2336    Derwood Kaplan, MD 08/23/18 [phone]

## 2018-08-22 NOTE — ED Triage Notes (Signed)
Pt arrives ambulatory to triage room with c/o low back pain. He was placing a motor on the ground, when he did he felt a "snap" in his back. No numbness/tingling in extremities. Aleve for pain PTA.

## 2018-10-19 ENCOUNTER — Telehealth: Payer: Self-pay | Admitting: Emergency Medicine

## 2018-10-19 NOTE — Telephone Encounter (Signed)
Lost to followup 

## 2019-07-20 ENCOUNTER — Emergency Department (HOSPITAL_COMMUNITY): Payer: No Typology Code available for payment source

## 2019-07-20 ENCOUNTER — Emergency Department (HOSPITAL_COMMUNITY)
Admission: EM | Admit: 2019-07-20 | Discharge: 2019-07-20 | Disposition: A | Discharge status: Home / Self Care | Payer: No Typology Code available for payment source | Attending: Emergency Medicine | Admitting: Emergency Medicine

## 2019-07-20 ENCOUNTER — Encounter (HOSPITAL_COMMUNITY): Payer: Self-pay | Admitting: Emergency Medicine

## 2019-07-20 DIAGNOSIS — S0081XA Abrasion of other part of head, initial encounter: Secondary | ICD-10-CM | POA: Diagnosis not present

## 2019-07-20 DIAGNOSIS — S0083XA Contusion of other part of head, initial encounter: Secondary | ICD-10-CM | POA: Insufficient documentation

## 2019-07-20 DIAGNOSIS — Y939 Activity, unspecified: Secondary | ICD-10-CM | POA: Diagnosis not present

## 2019-07-20 DIAGNOSIS — M79631 Pain in right forearm: Secondary | ICD-10-CM | POA: Diagnosis not present

## 2019-07-20 DIAGNOSIS — F17210 Nicotine dependence, cigarettes, uncomplicated: Secondary | ICD-10-CM | POA: Insufficient documentation

## 2019-07-20 DIAGNOSIS — L03012 Cellulitis of left finger: Secondary | ICD-10-CM | POA: Diagnosis not present

## 2019-07-20 DIAGNOSIS — Y999 Unspecified external cause status: Secondary | ICD-10-CM | POA: Insufficient documentation

## 2019-07-20 DIAGNOSIS — F1290 Cannabis use, unspecified, uncomplicated: Secondary | ICD-10-CM | POA: Diagnosis not present

## 2019-07-20 DIAGNOSIS — R0789 Other chest pain: Secondary | ICD-10-CM | POA: Diagnosis not present

## 2019-07-20 DIAGNOSIS — Y929 Unspecified place or not applicable: Secondary | ICD-10-CM | POA: Diagnosis not present

## 2019-07-20 DIAGNOSIS — R51 Headache: Secondary | ICD-10-CM | POA: Diagnosis present

## 2019-07-20 MED ORDER — OXYCODONE-ACETAMINOPHEN 5-325 MG PO TABS
1.0000 | ORAL_TABLET | Freq: Once | ORAL | Status: AC
  Administered 2019-07-20: 1 via ORAL
  Filled 2019-07-20: qty 1

## 2019-07-20 MED ORDER — METHOCARBAMOL 500 MG PO TABS: 500.0000 mg | ORAL_TABLET | Freq: Every evening | ORAL | 0 refills | Status: DC | PRN

## 2019-07-20 MED ORDER — CEPHALEXIN 500 MG PO CAPS: 500.0000 mg | ORAL_CAPSULE | Freq: Four times a day (QID) | ORAL | 0 refills | Status: DC

## 2019-07-20 NOTE — ED Notes (Signed)
Waiting for CT scaN

## 2019-07-20 NOTE — ED Notes (Signed)
Dermabond at Bedside
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
## 2019-07-20 NOTE — ED Notes (Signed)
Pt taken to radiology

## 2019-07-20 NOTE — Discharge Instructions (Signed)
Take NSAIDs or Tylenol as needed for the next week. Take this medicine with food. Take muscle relaxer at bedtime to help you sleep. This medicine makes you drowsy so do not take before driving or work Use a heating pad for sore muscles - use for 20 minutes several times a day Take Keflex 4 times daily for 5 days and do warm soaks for the thumb infection Return for worsening symptoms

## 2019-09-21 ENCOUNTER — Emergency Department (HOSPITAL_COMMUNITY)
Admission: EM | Admit: 2019-09-21 | Discharge: 2019-09-21 | Disposition: A | Discharge status: Home / Self Care | Payer: BC Managed Care – PPO | Attending: Emergency Medicine | Admitting: Emergency Medicine

## 2019-09-21 ENCOUNTER — Encounter (HOSPITAL_COMMUNITY): Payer: Self-pay | Admitting: Emergency Medicine

## 2019-09-21 DIAGNOSIS — K625 Hemorrhage of anus and rectum: Secondary | ICD-10-CM

## 2019-09-21 DIAGNOSIS — F17210 Nicotine dependence, cigarettes, uncomplicated: Secondary | ICD-10-CM | POA: Diagnosis not present

## 2019-09-21 DIAGNOSIS — R1030 Lower abdominal pain, unspecified: Secondary | ICD-10-CM | POA: Insufficient documentation

## 2019-09-21 DIAGNOSIS — Z800 Family history of malignant neoplasm of digestive organs: Secondary | ICD-10-CM | POA: Insufficient documentation

## 2019-09-21 LAB — COMPREHENSIVE METABOLIC PANEL
ALT: 46 U/L — ABNORMAL HIGH (ref 0–44)
AST: 29 U/L (ref 15–41)
Albumin: 4.4 g/dL (ref 3.5–5.0)
Alkaline Phosphatase: 65 U/L (ref 38–126)
Anion gap: 9 (ref 5–15)
BUN: 10 mg/dL (ref 6–20)
CO2: 26 mmol/L (ref 22–32)
Calcium: 9.2 mg/dL (ref 8.9–10.3)
Chloride: 107 mmol/L (ref 98–111)
Creatinine, Ser: 0.99 mg/dL (ref 0.61–1.24)
GFR calc Af Amer: >60 mL/min (ref >60)
GFR calc non Af Amer: >60 mL/min (ref >60)
Glucose, Bld: 96 mg/dL (ref 70–99)
Potassium: 4 mmol/L (ref 3.5–5.1)
Sodium: 142 mmol/L (ref 135–145)
Total Bilirubin: 0.8 mg/dL (ref 0.3–1.2)
Total Protein: 6.6 g/dL (ref 6.5–8.1)

## 2019-09-21 LAB — TYPE AND SCREEN
ABO/RH(D): A NEG
Antibody Screen: NEGATIVE

## 2019-09-21 LAB — CBC
HCT: 45.3 % (ref 39.0–52.0)
Hemoglobin: 15.3 g/dL (ref 13.0–17.0)
MCH: 31.4 pg (ref 26.0–34.0)
MCHC: 33.8 g/dL (ref 30.0–36.0)
MCV: 92.8 fL (ref 80.0–100.0)
Platelets: 224 10*3/uL (ref 150–400)
RBC: 4.88 MIL/uL (ref 4.22–5.81)
RDW: 12.4 % (ref 11.5–15.5)
WBC: 7.2 10*3/uL (ref 4.0–10.5)
nRBC: 0 % (ref 0.0–0.2)

## 2019-09-21 LAB — ABO/RH: ABO/RH(D): A NEG

## 2019-09-21 LAB — POC OCCULT BLOOD, ED: Fecal Occult Bld: POSITIVE — AB

## 2019-09-21 NOTE — ED Triage Notes (Signed)
Pt states for the past 3 months he has often had blood in his stool with bowel movements. Pt states for the past few days he has noticed an in crease in the blood with BMs- pt states before he may of not noticed the blood only on toilet paper but now the whole toilet will be red. Pt denies any n/v. Pt has lower abd cramping.

## 2019-09-21 NOTE — ED Notes (Signed)
Discharge instructions discussed with Pt. Pt verbalized understanding. Pt stable and ambulatory.    

## 2019-09-21 NOTE — ED Provider Notes (Addendum)
[facility] EMERGENCY DEPARTMENT Provider Note   CSN: [id number] Arrival date & time: 09/21/19  1009     History   Chief Complaint Chief Complaint  Patient presents with  . GI Bleeding    HPI Ricky Bond is a 33 y.o. male.     Patient is a 33 year old male with past medical history of major depressive disorder presents the emergency department for rectal bleeding for about 3 months.  Reports that it is been getting progressively worse with just some scant bleeding and now seeing large amounts of bright red blood per rectum with each bowel movement.  Reports the symptoms he has dark blood as well.  Reports that he has lower abdominal cramping and pain.  Denies any diarrhea, fever, chills, nausea, vomiting.  Denies any other bruises, bleeding.  Does not take excessive aspirin, or NSAIDs or blood thinners.  He does report that he has a family history of colon cancer in multiple relatives at unknown ages.     History reviewed. No pertinent past medical history.  Patient Active Problem List   Diagnosis Date Noted  . Adjustment disorder, unspecified 02/10/2018  . Intentional acetaminophen overdose ([place])   . MDD (major depressive disorder) 02/07/2018    History reviewed. No pertinent surgical history.      Home Medications    Prior to Admission medications   Not on File    Family History Family History  Family history unknown: Yes    Social History Social History   Tobacco Use  . Smoking status: Current Every Day Smoker    Packs/day: 1.00    Types: Cigarettes  . Smokeless tobacco: Never Used  Substance Use Topics  . Alcohol use: No    Frequency: Never  . Drug use: Yes    Types: Marijuana     Allergies   Patient has no known allergies.   Review of Systems Review of Systems  Constitutional: Negative for chills, diaphoresis, fatigue and fever.  HENT: Negative for nosebleeds.   Respiratory: Negative for cough and shortness of  breath.   Cardiovascular: Negative for chest pain.  Gastrointestinal: Positive for abdominal pain, anal bleeding, blood in stool and rectal pain. Negative for abdominal distention, constipation, diarrhea, nausea and vomiting.  Genitourinary: Negative for dysuria, flank pain and hematuria.  Musculoskeletal: Negative for back pain.  Neurological: Negative for dizziness and light-headedness.     Physical Exam Updated Vital Signs BP 126/81 (BP Location: Right Arm)   Pulse 64   Temp 98.4 F (36.9 C) (Oral)   Resp 14   SpO2 99%   Physical Exam Vitals signs and nursing note reviewed. Exam conducted with a chaperone present (NT [place] Crane).  Constitutional:      Appearance: Normal appearance.  HENT:     Head: Normocephalic.     Mouth/Throat:     Mouth: Mucous membranes are moist.  Eyes:     Conjunctiva/sclera: Conjunctivae normal.  Cardiovascular:     Rate and Rhythm: Normal rate and regular rhythm.  Pulmonary:     Effort: Pulmonary effort is normal.  Abdominal:     General: Abdomen is flat.     Tenderness: There is no abdominal tenderness. There is no guarding or rebound.  Genitourinary:    Rectum: Guaiac result positive. No mass, tenderness, anal fissure, external hemorrhoid or internal hemorrhoid. Normal anal tone.  Skin:    General: Skin is dry.     Capillary Refill: Capillary refill takes less than 2 seconds.  Neurological:  Mental Status: He is alert.  Psychiatric:        Mood and Affect: Mood normal.      ED Treatments / Results  Labs (all labs ordered are listed, but only abnormal results are displayed) Labs Reviewed  COMPREHENSIVE METABOLIC PANEL - Abnormal; Notable for the following components:      Result Value   ALT 46 (*)    All other components within normal limits  POC OCCULT BLOOD, ED - Abnormal; Notable for the following components:   Fecal Occult Bld POSITIVE (*)    All other components within normal limits  CBC  TYPE AND SCREEN  ABO/RH     EKG None  Radiology No results found.  Procedures Procedures (including critical care time)  Medications Ordered in ED Medications - No data to display   Initial Impression / Assessment and Plan / ED Course  I have reviewed the triage vital signs and the nursing notes.  Pertinent labs & imaging results that were available during my care of the patient were reviewed by me and considered in my medical decision making (see chart for details).  Clinical Course as of Sep 20 1230  Tue Sep 21, 2019  1151 Patient with rectal bleeding x3 months. CBC reassuring. Rectal exam without hemorrhoids or mass. +maroon colored stool. NO current belly pain. Patient has f/u with PMD and it was made clear that he should not miss this appointment and would likely need to be scheduled for colonoscopy. Patient aware of plan.    [KM]    Clinical Course User Index [KM] Arlyn Dunning, PA-C       Based on review of vitals, medical screening exam, lab work and/or imaging, there does not appear to be an acute, emergent etiology for the patient's symptoms. Counseled pt on good return precautions and encouraged both PCP and ED follow-up as needed.  Prior to discharge, I also discussed incidental imaging findings with patient in detail and advised appropriate, recommended follow-up in detail.  Clinical Impression: No diagnosis found.  Disposition: Discharge  Prior to providing a prescription for a controlled substance, I independently reviewed the patient's recent prescription history on the [place] Controlled Substance Reporting System. The patient had no recent or regular prescriptions and was deemed appropriate for a brief, less than 3 day prescription of narcotic for acute analgesia.  This note was prepared with assistance of Conservation officer, historic buildings. Occasional wrong-word or sound-a-like substitutions may have occurred due to the inherent limitations of voice recognition software.    Final Clinical Impressions(s) / ED Diagnoses   Final diagnoses:  None    ED Discharge Orders    None       Arlyn Dunning, PA-C 09/21/19 1233    Arlyn Dunning, PA-C 09/21/19 1327    Pricilla Loveless, MD 09/21/19 1524

## 2019-09-21 NOTE — Discharge Instructions (Signed)
Thank you for allowing me to care for you today. Please return to the emergency department if you have new or worsening symptoms. Take your medications as instructed.  ° °

## 2019-11-13 IMAGING — CT CT RENAL STONE PROTOCOL
2 of 4 series · 16 of 46 positions shown, 18 images · non-contrast
Comparison: None.

CLINICAL DATA: Low back pain and difficulty urinating

EXAM:
CT ABDOMEN AND PELVIS WITHOUT CONTRAST
TECHNIQUE: Multidetector CT imaging of the abdomen and pelvis was performed
following the standard protocol without IV contrast.

[Series 3: renal stone 5.0 · axial · 0.80mm/px · z∈[+791,+1251]mm · 13 of 102 slices shown, 15 images]
[im 5/102  soft-tissue]
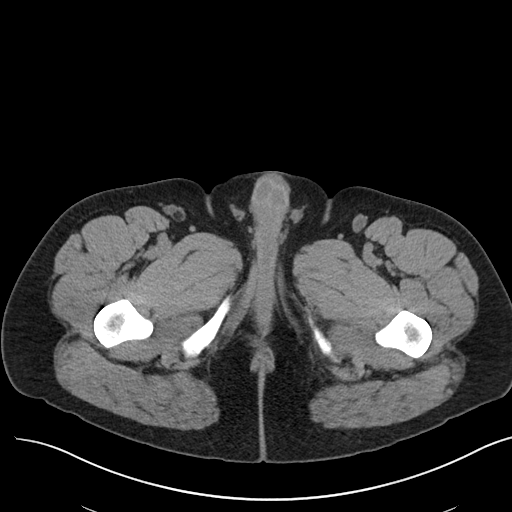
[im 5/102  bone]
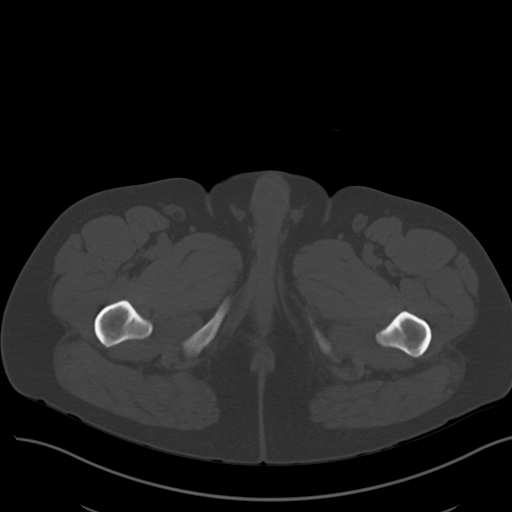
[im 13/102  soft-tissue]
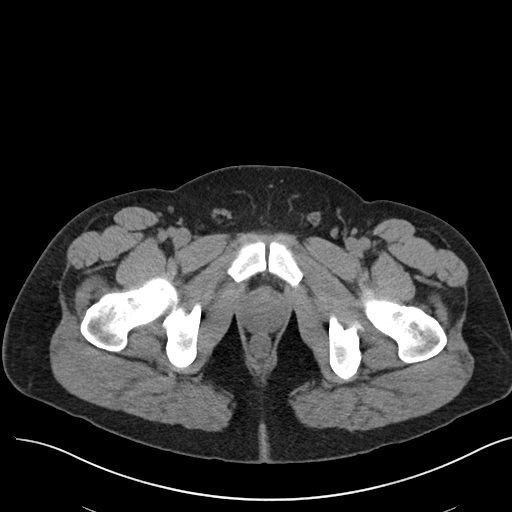
[im 22/102  soft-tissue]
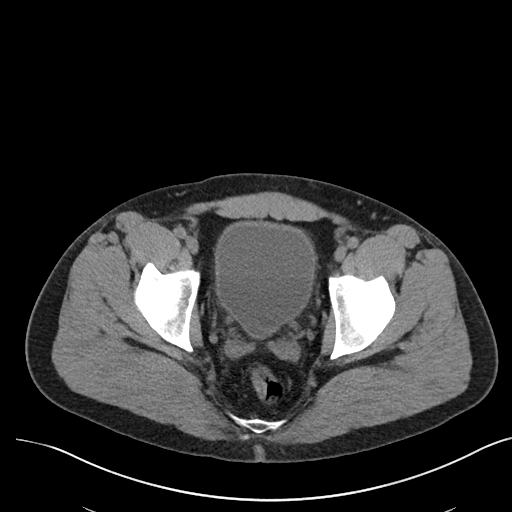
[im 30/102  soft-tissue]
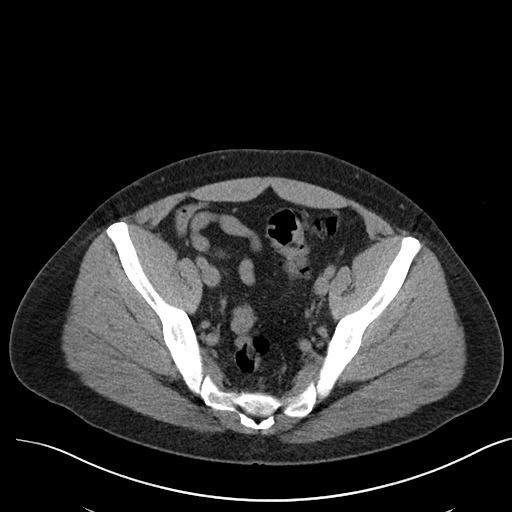
[im 34/102  soft-tissue]
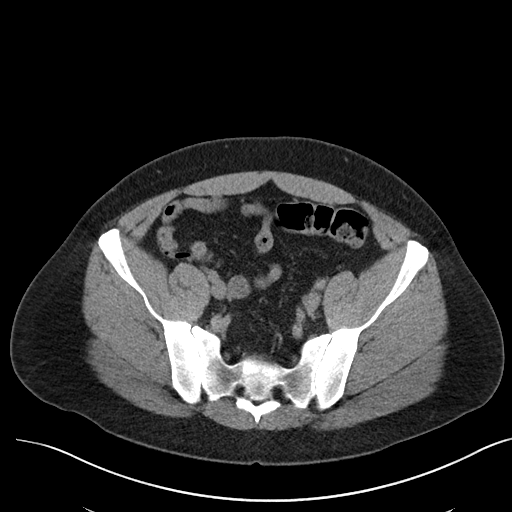
[im 43/102  soft-tissue]
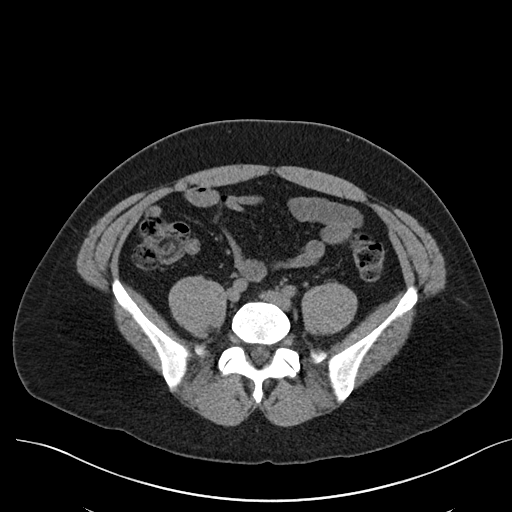
[im 51/102  soft-tissue]
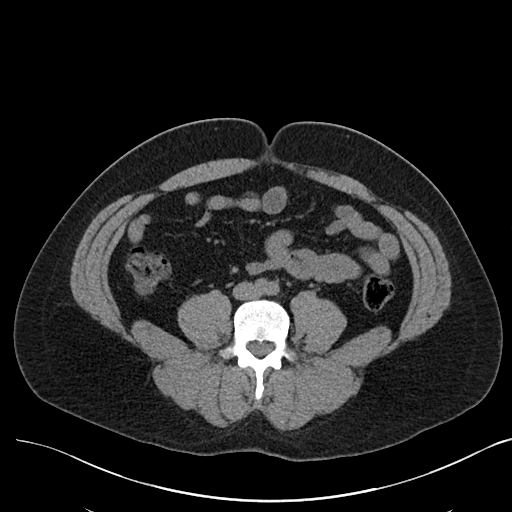
[im 59/102  soft-tissue]
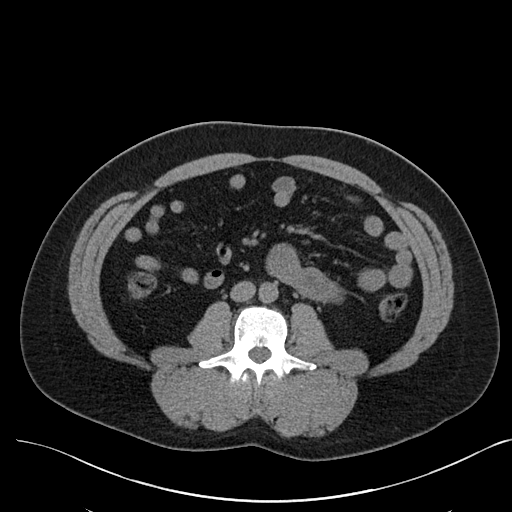
[im 68/102  soft-tissue]
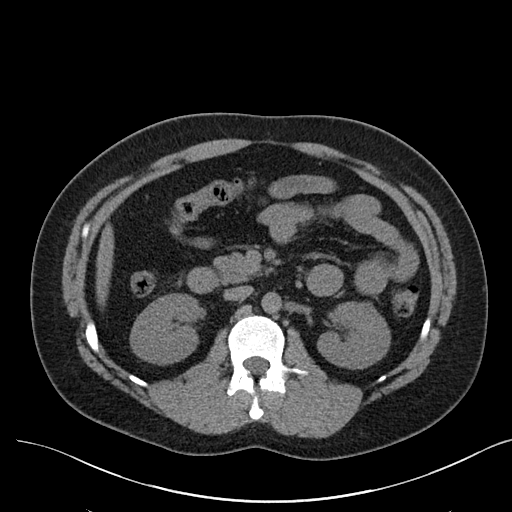
[im 68/102  bone]
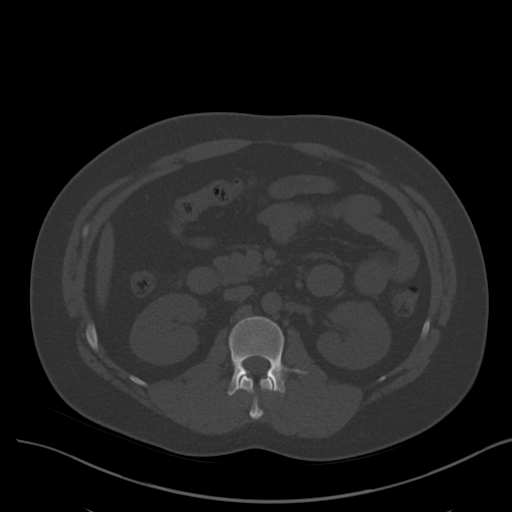
[im 72/102  soft-tissue]
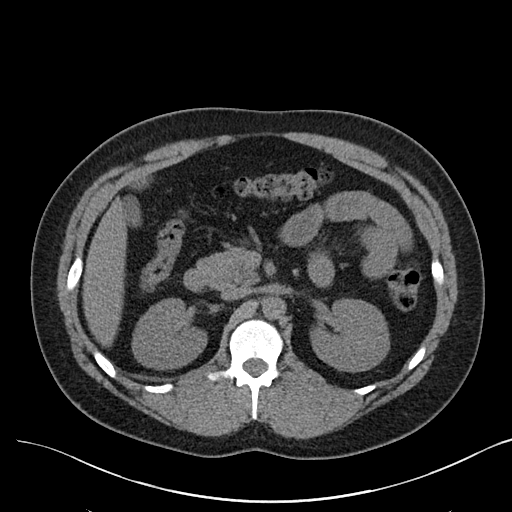
[im 80/102  soft-tissue]
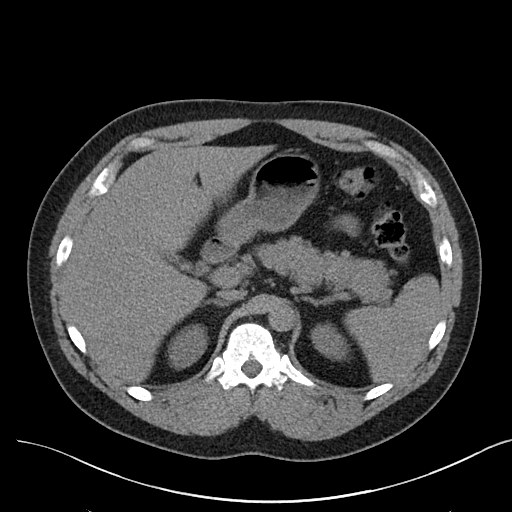
[im 89/102  soft-tissue]
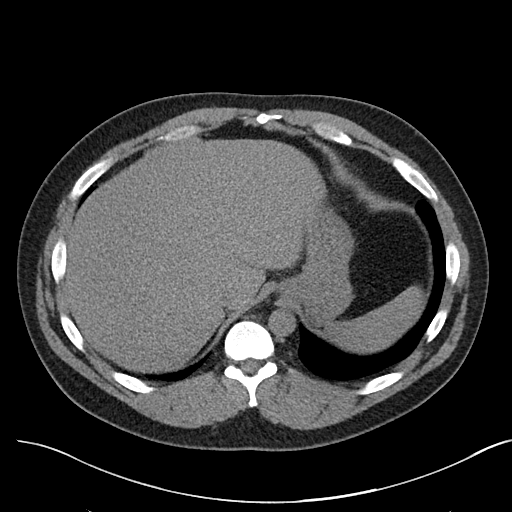
[im 97/102  soft-tissue]
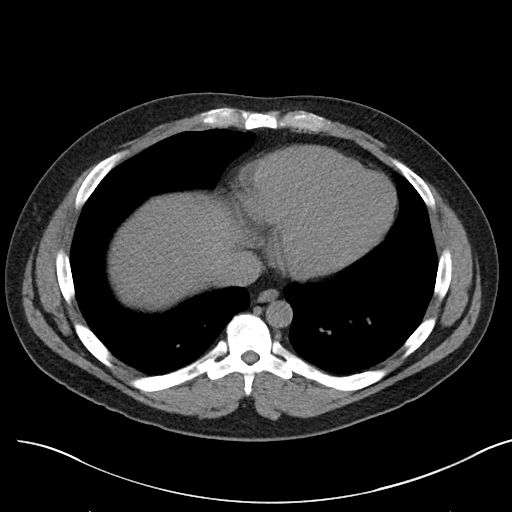

[Series 5: renal stone 3.0 cor · coronal · 0.79mm/px · 3 of 101 slices shown]
[im 34/101  soft-tissue]
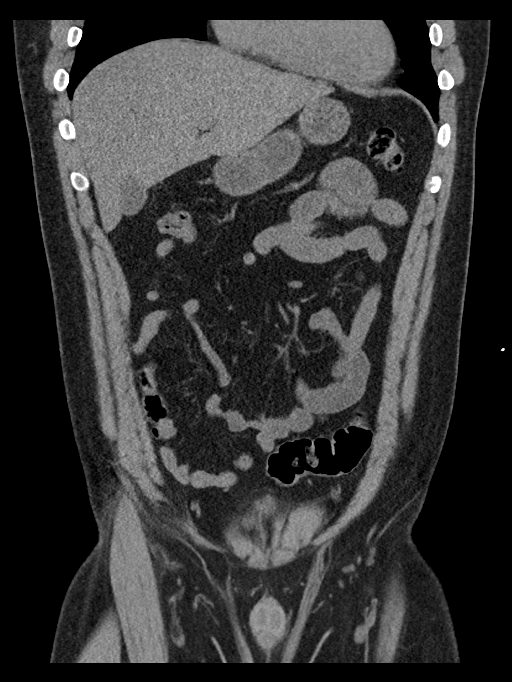
[im 45/101  soft-tissue]
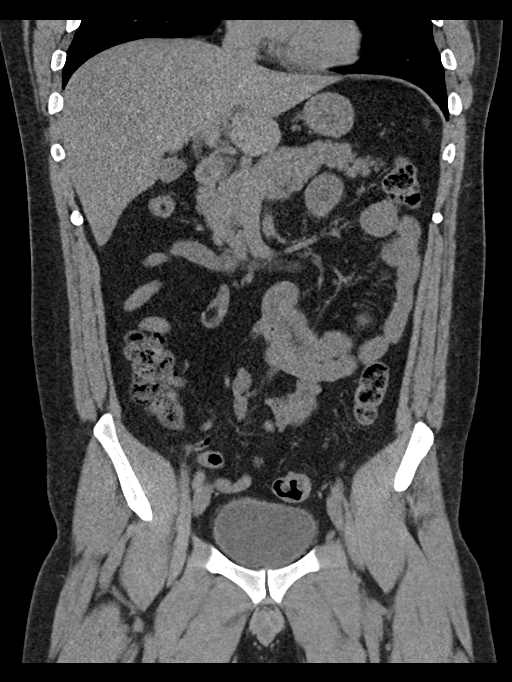
[im 56/101  soft-tissue]
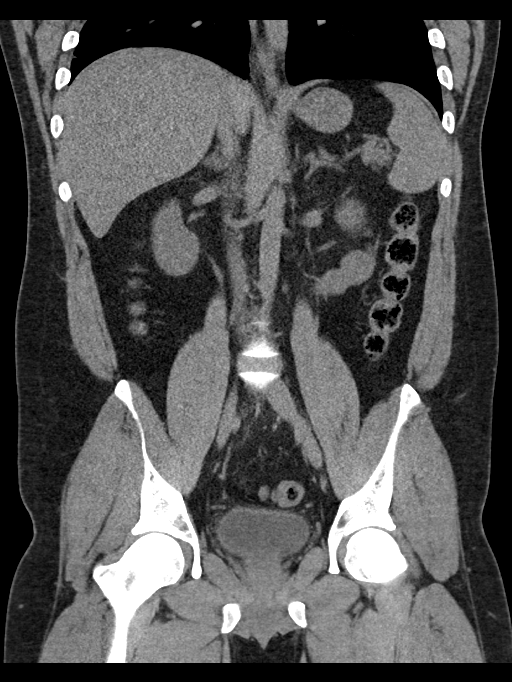

[16 of 46 positions shown; findings below may reference images not displayed]

FINDINGS: Lower chest: No acute abnormality.

Hepatobiliary: Liver is within normal limits. The gallbladder is
well distended with a single dependent gallstone within. No
complicating factors are seen.

Pancreas: Unremarkable. No pancreatic ductal dilatation or
surrounding inflammatory changes.

Spleen: Normal in size without focal abnormality.

Adrenals/Urinary Tract: Adrenal glands are unremarkable. Kidneys are
normal, without renal calculi, focal lesion, or hydronephrosis.
Bladder is unremarkable.

Stomach/Bowel: Stomach is within normal limits. Appendix appears
normal. No evidence of bowel wall thickening, distention, or
inflammatory changes.

Vascular/Lymphatic: No significant vascular findings are present. No
enlarged abdominal or pelvic lymph nodes.

Reproductive: Prostate is unremarkable.

Other: No abdominal wall hernia or abnormality. No abdominopelvic
ascites.

Musculoskeletal: No acute or significant osseous findings.
IMPRESSION: Cholelithiasis without complicating factors. No other focal
abnormality is noted.

## 2022-12-01 ENCOUNTER — Emergency Department (HOSPITAL_BASED_OUTPATIENT_CLINIC_OR_DEPARTMENT_OTHER): Payer: Commercial Managed Care - HMO

## 2022-12-01 ENCOUNTER — Ambulatory Visit
Admission: EM | Admit: 2022-12-01 | Discharge: 2022-12-01 | Disposition: A | Discharge status: Home / Self Care | Payer: Commercial Managed Care - HMO

## 2022-12-01 ENCOUNTER — Emergency Department (HOSPITAL_BASED_OUTPATIENT_CLINIC_OR_DEPARTMENT_OTHER): Payer: Commercial Managed Care - HMO | Admitting: Radiology

## 2022-12-01 ENCOUNTER — Emergency Department (HOSPITAL_BASED_OUTPATIENT_CLINIC_OR_DEPARTMENT_OTHER)
Admission: EM | Admit: 2022-12-01 | Discharge: 2022-12-01 | Disposition: A | Discharge status: Home / Self Care | Payer: Commercial Managed Care - HMO | Attending: Emergency Medicine | Admitting: Emergency Medicine

## 2022-12-01 DIAGNOSIS — M545 Low back pain, unspecified: Secondary | ICD-10-CM | POA: Insufficient documentation

## 2022-12-01 DIAGNOSIS — Y92410 Unspecified street and highway as the place of occurrence of the external cause: Secondary | ICD-10-CM | POA: Insufficient documentation

## 2022-12-01 DIAGNOSIS — M25511 Pain in right shoulder: Secondary | ICD-10-CM

## 2022-12-01 DIAGNOSIS — S4991XA Unspecified injury of right shoulder and upper arm, initial encounter: Secondary | ICD-10-CM | POA: Insufficient documentation

## 2022-12-01 DIAGNOSIS — R519 Headache, unspecified: Secondary | ICD-10-CM | POA: Diagnosis not present

## 2022-12-01 DIAGNOSIS — S01111A Laceration without foreign body of right eyelid and periocular area, initial encounter: Secondary | ICD-10-CM | POA: Diagnosis not present

## 2022-12-01 DIAGNOSIS — S0181XA Laceration without foreign body of other part of head, initial encounter: Secondary | ICD-10-CM

## 2022-12-01 DIAGNOSIS — S0990XA Unspecified injury of head, initial encounter: Secondary | ICD-10-CM | POA: Diagnosis present

## 2022-12-01 MED ORDER — ACETAMINOPHEN 325 MG PO TABS
650.0000 mg | ORAL_TABLET | Freq: Once | ORAL | Status: AC
  Administered 2022-12-01: 650 mg via ORAL
  Filled 2022-12-01: qty 2

## 2022-12-01 MED ORDER — LIDOCAINE 5 % EX PTCH
1.0000 | MEDICATED_PATCH | Freq: Once | CUTANEOUS | Status: DC
  Administered 2022-12-01: 1 via TRANSDERMAL
  Filled 2022-12-01: qty 1

## 2022-12-01 MED ORDER — IBUPROFEN 800 MG PO TABS
800.0000 mg | ORAL_TABLET | Freq: Once | ORAL | Status: AC
  Administered 2022-12-01: 800 mg via ORAL
  Filled 2022-12-01: qty 1

## 2022-12-01 NOTE — Discharge Instructions (Signed)
Go straight to the emergency department as soon as you leave urgent care for further evaluation and management. 
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
